# Patient Record
Sex: Female | Born: 1995
Health system: Southern US, Community
[De-identification: ages and names within clinical notes are randomized; demographics above are authoritative.]

## PROBLEM LIST (undated history)

## (undated) DIAGNOSIS — F32A Depression, unspecified: Secondary | ICD-10-CM

## (undated) DIAGNOSIS — L709 Acne, unspecified: Secondary | ICD-10-CM

## (undated) DIAGNOSIS — F329 Major depressive disorder, single episode, unspecified: Secondary | ICD-10-CM

## (undated) HISTORY — DX: Acne, unspecified: L70.9

## (undated) HISTORY — PX: NO PAST SURGERIES: SHX2092

## (undated) HISTORY — DX: Depression, unspecified: F32.A

---
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
## 2019-05-21 ENCOUNTER — Ambulatory Visit (INDEPENDENT_AMBULATORY_CARE_PROVIDER_SITE_OTHER): Payer: No Typology Code available for payment source | Admitting: Family Medicine

## 2019-05-21 ENCOUNTER — Encounter: Payer: Self-pay | Admitting: Family Medicine

## 2019-05-21 ENCOUNTER — Other Ambulatory Visit: Payer: Self-pay

## 2019-05-21 VITALS — BP 100/68 | HR 96 | Temp 98.5°F | Ht 66.0 in | Wt 161.1 lb

## 2019-05-21 DIAGNOSIS — R350 Frequency of micturition: Secondary | ICD-10-CM

## 2019-05-21 LAB — POCT URINALYSIS DIPSTICK
Bilirubin, UA: NEGATIVE
Glucose, UA: NEGATIVE
Ketones, UA: NEGATIVE
Leukocytes, UA: NEGATIVE
Nitrite, UA: NEGATIVE
Protein, UA: NEGATIVE
Spec Grav, UA: 1.015 (ref 1.010–1.025)
Urobilinogen, UA: 0.2 E.U./dL
pH, UA: 6.5 (ref 5.0–8.0)

## 2019-05-21 NOTE — Progress Notes (Signed)
Chief Complaint  Patient presents with  . New Patient (Initial Visit)  . Dysuria  . Urinary Frequency       New Patient Visit SUBJECTIVE: HPI: Sara Odom is an 23 y.o.female who is being seen for establishing care.  Over past several years, she will get recurring dysuria, freq, odor, cloudy, urgency, retention. Will use urostat and try to drink cranberry juice. Used to get UTI's when she was much younger. Unsure over past 3 years since this became recurrent. Does not feel overly stressed compared to usual when these flares occur. No issues with constipation. No injury. Denies flank pain, fevers, injury, discharge, new sexual partners.   No Known Allergies  Past Medical History:  Diagnosis Date  . Depression    Past Surgical History:  Procedure Laterality Date  . NO PAST SURGERIES     Family History  Problem Relation Age of Onset  . Hypertension Mother   . Diabetes Father    No Known Allergies   Takes no meds routinely.   ROS Const Denies fevers  GU: Denies bleeding   OBJECTIVE: BP 100/68 (BP Location: Left Arm, Patient Position: Sitting, Cuff Size: Normal)   Pulse 96   Temp 98.5 F (36.9 C) (Oral)   Ht 5\' 6"  (1.676 m)   Wt 161 lb 2 oz (73.1 kg)   SpO2 98%   BMI 26.01 kg/m   Constitutional: -  VS reviewed -  Well developed, well nourished, appears stated age -  No apparent distress  Psychiatric: -  Oriented to person, place, and time -  Memory intact -  Affect and mood normal -  Fluent conversation, good eye contact -  Judgment and insight age appropriate  Eye: -  Conjunctivae clear, no discharge -  Pupils symmetric, round, reactive to light  ENMT: -  MMM    Pharynx moist, no exudate, no erythema  Neck: -  No gross swelling, no palpable masses -  Thyroid midline, not enlarged, mobile, no palpable masses  Cardiovascular: -  RRR -  No LE edema  Respiratory: -  Normal respiratory effort, no accessory muscle use, no retraction -  Breath  sounds equal, no wheezes, no ronchi, no crackles  Gastrointestinal: -  Bowel sounds normal -  No tenderness, no distention, no guarding, no masses  Skin: -  No significant lesion on inspection -  Warm and dry to palpation   ASSESSMENT/PLAN: Frequent urination - Plan: POCT Urinalysis Dipstick, Urine Culture, offered emp tx, declined, will wait for cx. Could be related to constipation, but less likely given hx. Would consider pelvic floor dys if no answers with urine, GYN info given. IC also consideration.    Patient should return in 10 mo for CPE, sooner if ins requiring before 9/1.Marland Kitchen The patient voiced understanding and agreement to the plan.   [place], DO 05/21/19  3:33 PM

## 2019-05-21 NOTE — Patient Instructions (Addendum)
Stay hydrated.   Warning signs/symptoms: Uncontrollable nausea/vomiting, fevers, worsening symptoms despite treatment, confusion.  Give [place] around 2 business days to get culture back to you.  I always consider constipation as a cause of symptoms like this.  Pelvic floor muscle spasms can also contribute to this. If no good answer is found from this workup, I would look there next.  Mind caffeine, alcohol, and acidic food intake.   Call Center for [place] at [facility] at [phone] for an appointment.  They are located at [address], [address], [place], [place], [address] (right across the hall from our office).   Let us know if you need anything.

## 2019-05-22 LAB — URINE CULTURE
MICRO NUMBER:: 748675
SPECIMEN QUALITY:: ADEQUATE

## 2019-05-23 ENCOUNTER — Encounter: Payer: Self-pay | Admitting: Family Medicine

## 2019-08-09 ENCOUNTER — Other Ambulatory Visit: Payer: Self-pay

## 2019-08-09 ENCOUNTER — Encounter: Payer: Self-pay | Admitting: Family Medicine

## 2019-08-09 ENCOUNTER — Ambulatory Visit (INDEPENDENT_AMBULATORY_CARE_PROVIDER_SITE_OTHER): Payer: No Typology Code available for payment source | Admitting: Family Medicine

## 2019-08-09 VITALS — BP 108/68 | HR 88 | Temp 97.3°F | Ht 66.0 in | Wt 167.5 lb

## 2019-08-09 DIAGNOSIS — Z3009 Encounter for other general counseling and advice on contraception: Secondary | ICD-10-CM | POA: Diagnosis not present

## 2019-08-09 DIAGNOSIS — Z30017 Encounter for initial prescription of implantable subdermal contraceptive: Secondary | ICD-10-CM | POA: Diagnosis not present

## 2019-08-09 LAB — POCT URINE PREGNANCY: Preg Test, Ur: NEGATIVE

## 2019-08-09 MED ORDER — ETONOGESTREL 68 MG ~~LOC~~ IMPL
68.0000 mg | DRUG_IMPLANT | Freq: Once | SUBCUTANEOUS | Status: AC
  Administered 2019-08-09: 68 mg via SUBCUTANEOUS

## 2019-08-09 NOTE — Progress Notes (Signed)
Chief Complaint  Patient presents with  . Contraception    Nexplanon    Subjective: Patient is a 23 y.o. female here for contraception discussion.  Pt interested in Sara Odom. Has been on OCPs in past. Is currently on period. Not transitioning from another form of contraception.   ROS: GU: Normal periods  Past Medical History:  Diagnosis Date  . Depression     Objective: BP 108/68 (BP Location: Left Arm, Patient Position: Sitting, Cuff Size: Normal)   Pulse 88   Temp (!) 97.3 F (36.3 C) (Temporal)   Ht 5\' 6"  (1.676 m)   Wt 167 lb 8 oz (76 kg)   LMP 08/07/2019 (Exact Date)   SpO2 96%   BMI 27.04 kg/m  General: Awake, appears stated age Lungs: No accessory muscle use Psych: Age appropriate judgment and insight, normal affect and mood  Procedure Note, Nexplanon placement: Informed consent obtained. The patient was placed in a comfortable supine position and the non-dominant arm, left, was positioned to access the sulcus between the bicep and triceps muscles.  Measurements were made, and markings were placed at 8 cm, 10 cm, 12 cm and 14 cm from the medial epicondyle of the humerus.  The area was prepped with alcohol and a 27 gauge needle was used to inject 3 mL of 1% lidocaine with epinephrine.  The area was then cleaned with betadine. Sterile gloves were then utilized. The Nexplanon trocar was then inserted at the end of anesthetized area and pushed gently through the subcutaneous tissue along the line of the sulcus.  The seal was broken and the implant was held in place while the trocar was withdrawn.  The implant was then palpated by both the patient and me.  The arm was cleansed and the puncture site from the trocar covered with triple antibiotic ointment and pressure dressing.  Hemostasis was observed at the site. There were no complications noted.  The patient tolerated the procedure well.  She was instructed that the device must be removed in three  years.   Assessment and Plan: Contraceptive education  Insertion of Nexplanon  Counseled on various methods of contraception. Discussed risks and benefits of Nexplanon. She wished to proceed. Preg test neg. 7 d back up contraception rec'd.  F/u prn.  The patient voiced understanding and agreement to the plan.  [place], DO 08/09/19  4:22 PM

## 2019-08-09 NOTE — Patient Instructions (Addendum)
Do not shower for the rest of the day. When you do wash it, use only soap and water. Do not vigorously scrub. Keep the area clean and dry.   Things to look out for: increasing pain not relieved by ibuprofen/acetaminophen, fevers, spreading redness, drainage of pus, or foul odor.  Have back up for contraception for the next 7 days.   Let us know if you need anything.

## 2019-08-09 NOTE — Addendum Note (Signed)
Addended by: Sharon Seller B on: 08/09/2019 04:52 PM   Modules accepted: Orders

## 2020-03-20 ENCOUNTER — Other Ambulatory Visit: Payer: Self-pay

## 2020-03-20 ENCOUNTER — Other Ambulatory Visit (HOSPITAL_COMMUNITY)
Admission: RE | Admit: 2020-03-20 | Discharge: 2020-03-20 | Disposition: A | Discharge status: Home / Self Care | Payer: No Typology Code available for payment source | Source: Ambulatory Visit | Attending: Family Medicine | Admitting: Family Medicine

## 2020-03-20 ENCOUNTER — Encounter: Payer: Self-pay | Admitting: Family Medicine

## 2020-03-20 ENCOUNTER — Ambulatory Visit (INDEPENDENT_AMBULATORY_CARE_PROVIDER_SITE_OTHER): Payer: No Typology Code available for payment source | Admitting: Family Medicine

## 2020-03-20 VITALS — BP 108/64 | HR 102 | Temp 96.9°F | Ht 66.0 in | Wt 159.5 lb

## 2020-03-20 DIAGNOSIS — Z23 Encounter for immunization: Secondary | ICD-10-CM

## 2020-03-20 DIAGNOSIS — Z113 Encounter for screening for infections with a predominantly sexual mode of transmission: Secondary | ICD-10-CM | POA: Insufficient documentation

## 2020-03-20 DIAGNOSIS — L709 Acne, unspecified: Secondary | ICD-10-CM

## 2020-03-20 DIAGNOSIS — Z0000 Encounter for general adult medical examination without abnormal findings: Secondary | ICD-10-CM | POA: Diagnosis not present

## 2020-03-20 DIAGNOSIS — Z114 Encounter for screening for human immunodeficiency virus [HIV]: Secondary | ICD-10-CM

## 2020-03-20 LAB — COMPREHENSIVE METABOLIC PANEL
ALT: 25 U/L (ref 0–35)
AST: 19 U/L (ref 0–37)
Albumin: 4.3 g/dL (ref 3.5–5.2)
Alkaline Phosphatase: 96 U/L (ref 39–117)
BUN: 15 mg/dL (ref 6–23)
CO2: 27 mEq/L (ref 19–32)
Calcium: 9.2 mg/dL (ref 8.4–10.5)
Chloride: 105 mEq/L (ref 96–112)
Creatinine, Ser: 0.89 mg/dL (ref 0.40–1.20)
GFR: 78.53 mL/min (ref >60.00)
Glucose, Bld: 92 mg/dL (ref 70–99)
Potassium: 4.6 mEq/L (ref 3.5–5.1)
Sodium: 138 mEq/L (ref 135–145)
Total Bilirubin: 0.5 mg/dL (ref 0.2–1.2)
Total Protein: 7.3 g/dL (ref 6.0–8.3)

## 2020-03-20 LAB — CBC
HCT: 39.9 % (ref 36.0–46.0)
Hemoglobin: 13.9 g/dL (ref 12.0–15.0)
MCHC: 34.8 g/dL (ref 30.0–36.0)
MCV: 91.9 fl (ref 78.0–100.0)
Platelets: 259 10*3/uL (ref 150.0–400.0)
RBC: 4.35 Mil/uL (ref 3.87–5.11)
RDW: 12.3 % (ref 11.5–15.5)
WBC: 6.1 10*3/uL (ref 4.0–10.5)

## 2020-03-20 LAB — LIPID PANEL
Cholesterol: 115 mg/dL (ref 0–200)
HDL: 31.6 mg/dL — ABNORMAL LOW (ref >39.00)
LDL Cholesterol: 76 mg/dL (ref 0–99)
NonHDL: 83.85
Total CHOL/HDL Ratio: 4
Triglycerides: 41 mg/dL (ref 0.0–149.0)
VLDL: 8.2 mg/dL (ref 0.0–40.0)

## 2020-03-20 NOTE — Addendum Note (Signed)
Addended by: Scharlene Gloss B on: 03/20/2020 10:12 AM   Modules accepted: Orders

## 2020-03-20 NOTE — Patient Instructions (Addendum)
Give us 2-3 business days to get the results of your labs back.  ° °Keep the diet clean and stay active. ° °OK to use Debrox (peroxide) in the ear to loosen up wax. Also recommend using a bulb syringe (for removing boogers from baby's noses) to flush through warm water. Do not use Q-tips as this can impact wax further. ° °If you do not hear anything about your referral in the next 1-2 weeks, call our office and ask for an update. ° °Let us know if you need anything. °

## 2020-03-20 NOTE — Progress Notes (Signed)
Chief Complaint  Patient presents with   Annual Exam     Well Woman Sara Odom is here for a complete physical.   Her last physical was >1 year ago.  Current diet: in general, a "healthy" diet. Current exercise: cardio, wt resistance exericse  Fatigue? Not out of ordinary Seatbelt? Yes  Health Maintenance Pap/HPV- Yes Tetanus- No HIV screening- No STI screening- No  Past Medical History:  Diagnosis Date   Depression      Past Surgical History:  Procedure Laterality Date   NO PAST SURGERIES      Medications  Takes no meds routinely.    Allergies No Known Allergies  Review of Systems: Constitutional:  no unexpected weight changes Eye:  no recent significant change in vision Ear/Nose/Mouth/Throat:  Ears:  no tinnitus or vertigo and no recent change in hearing Nose/Mouth/Throat:  no complaints of nasal congestion, no sore throat Cardiovascular: no chest pain Respiratory:  no cough and no shortness of breath Gastrointestinal:  no abdominal pain, no change in bowel habits GU:  Female: negative for dysuria or pelvic pain Musculoskeletal/Extremities:  no pain of the joints Integumentary (Skin/Breast):  +acne on upper back Neurologic:  no headaches Endocrine:  denies fatigue Hematologic/Lymphatic:  No areas of easy bleeding  Exam BP 108/64 (BP Location: Left Arm, Patient Position: Sitting, Cuff Size: Normal)    Pulse (!) 102    Temp (!) 96.9 F (36.1 C) (Temporal)    Ht 5\' 6"  (1.676 m)    Wt 159 lb 8 oz (72.3 kg)    SpO2 100%    BMI 25.74 kg/m  General:  well developed, well nourished, in no apparent distress Skin:  no significant moles, warts, or growths on exposed skin Head:  no masses, lesions, or tenderness Eyes:  pupils equal and round, sclera anicteric without injection Ears:  canals without lesions, TMs shiny without retraction, no obvious effusion, no erythema Nose:  nares patent, septum midline, mucosa normal, and no drainage or sinus  tenderness Throat/Pharynx:  lips and gingiva without lesion; tongue and uvula midline; non-inflamed pharynx; no exudates or postnasal drainage Neck: neck supple without adenopathy, thyromegaly, or masses Lungs:  clear to auscultation, breath sounds equal bilaterally, no respiratory distress Cardio:  regular rate and rhythm, no bruits, no LE edema Abdomen:  abdomen soft, nontender; bowel sounds normal; no masses or organomegaly Genital: Defer to GYN Musculoskeletal:  symmetrical muscle groups noted without atrophy or deformity Extremities:  no clubbing, cyanosis, or edema, no deformities, no skin discoloration Neuro:  gait normal; deep tendon reflexes normal and symmetric Psych: well oriented with normal range of affect and appropriate judgment/insight  Assessment and Plan  Well adult exam - Plan: CBC, Comprehensive metabolic panel, Lipid panel  Screening for HIV (human immunodeficiency virus) - Plan: HIV Antibody (routine testing w rflx)  Routine screening for STI (sexually transmitted infection) - Plan: Urine cytology ancillary only([place])  Acne, unspecified acne type - Plan: Ambulatory referral to Dermatology   Well 24 y.o. female. Counseled on diet and exercise. Other orders as above. Follow up in 1 yr or prn. The patient voiced understanding and agreement to the plan.  [address] [place], DO 03/20/20 10:06 AM

## 2020-03-21 LAB — URINE CYTOLOGY ANCILLARY ONLY
Chlamydia: NEGATIVE
Comment: NEGATIVE
Comment: NEGATIVE
Comment: NORMAL
Neisseria Gonorrhea: NEGATIVE
Trichomonas: NEGATIVE

## 2020-03-21 LAB — HIV ANTIBODY (ROUTINE TESTING W REFLEX): HIV 1&2 Ab, 4th Generation: NONREACTIVE

## 2020-03-23 ENCOUNTER — Telehealth: Payer: Self-pay | Admitting: Physician Assistant

## 2020-03-23 NOTE — Telephone Encounter (Signed)
Patient called to schedule referral appointment.  The appointment was scheduled for 06/29/20 at 10:45 a.m. with Shelly Flatten.

## 2020-05-07 ENCOUNTER — Other Ambulatory Visit: Payer: Self-pay

## 2020-05-07 ENCOUNTER — Emergency Department (HOSPITAL_COMMUNITY)
Admission: EM | Admit: 2020-05-07 | Discharge: 2020-05-08 | Disposition: A | Discharge status: Home / Self Care | Payer: No Typology Code available for payment source | Attending: Emergency Medicine | Admitting: Emergency Medicine

## 2020-05-07 ENCOUNTER — Encounter (HOSPITAL_COMMUNITY): Payer: Self-pay

## 2020-05-07 ENCOUNTER — Emergency Department (HOSPITAL_COMMUNITY): Payer: No Typology Code available for payment source

## 2020-05-07 DIAGNOSIS — Y929 Unspecified place or not applicable: Secondary | ICD-10-CM | POA: Insufficient documentation

## 2020-05-07 DIAGNOSIS — S61411A Laceration without foreign body of right hand, initial encounter: Secondary | ICD-10-CM | POA: Diagnosis not present

## 2020-05-07 DIAGNOSIS — Y939 Activity, unspecified: Secondary | ICD-10-CM | POA: Insufficient documentation

## 2020-05-07 DIAGNOSIS — W208XXA Other cause of strike by thrown, projected or falling object, initial encounter: Secondary | ICD-10-CM | POA: Insufficient documentation

## 2020-05-07 DIAGNOSIS — S61511A Laceration without foreign body of right wrist, initial encounter: Secondary | ICD-10-CM | POA: Diagnosis not present

## 2020-05-07 DIAGNOSIS — S66322A Laceration of extensor muscle, fascia and tendon of right middle finger at wrist and hand level, initial encounter: Secondary | ICD-10-CM | POA: Diagnosis not present

## 2020-05-07 DIAGNOSIS — Y998 Other external cause status: Secondary | ICD-10-CM | POA: Insufficient documentation

## 2020-05-07 DIAGNOSIS — S66901S Unspecified injury of unspecified muscle, fascia and tendon at wrist and hand level, right hand, sequela: Secondary | ICD-10-CM

## 2020-05-07 NOTE — ED Triage Notes (Addendum)
Patient here for lac to right forearm. Patient states the vase was falling and she caught it, which cut here. Patient  Is very tearful and anxious at this time. Patient has minimal movement and sensation to right 2nd finger. < 2 sec capillary refill.

## 2020-05-07 NOTE — ED Provider Notes (Signed)
[facility]-EMERGENCY DEPT Provider Note   CSN: [id number] Arrival date & time: 05/07/20  2150   Time seen 11:23 PM  History Chief Complaint  Patient presents with  . Laceration    right forearm    Sara Odom is a 24 y.o. female.  HPI   Patient states about 9:30 PM a vase fell off a shelf and landed on a countertop that she was standing by and the shards lacerated the dorsum of her right wrist and hand.  She reports loss of range of motion of her right middle finger but has intact sensation.  Patient is right-handed.  She states her last tetanus was in the past month.  She states the vase was clear glass in color.  PCP Sharlene Dory, DO   Past Medical History:  Diagnosis Date  . Depression     There are no problems to display for this patient.   Past Surgical History:  Procedure Laterality Date  . NO PAST SURGERIES       OB History   No obstetric history on file.     Family History  Problem Relation Age of Onset  . Hypertension Mother   . Diabetes Father     Social History   Tobacco Use  . Smoking status: Never Smoker  . Smokeless tobacco: Never Used  Vaping Use  . Vaping Use: Never used  Substance Use Topics  . Alcohol use: Yes    Alcohol/week: 4.0 standard drinks    Types: 4 Standard drinks or equivalent per week    Comment: 1 x week   . Drug use: Yes    Frequency: 1.0 times per week    Types: Marijuana    Comment: last week    Home Medications Prior to Admission medications   Not on File    Allergies    Shellfish allergy  Review of Systems   Review of Systems  All other systems reviewed and are negative.   Physical Exam Updated Vital Signs BP (!) 127/87 (BP Location: Left Arm)   Pulse 93   Temp 98.4 F (36.9 C) (Oral)   Resp 20   Ht 5\' 6"  (1.676 m)   Wt 70.3 kg   LMP 03/22/2020 (Approximate)   SpO2 98%   BMI 25.02 kg/m   Physical Exam Vitals and nursing note reviewed.   Constitutional:      General: She is in acute distress.     Appearance: Normal appearance. She is normal weight.  HENT:     Head: Normocephalic and atraumatic.     Right Ear: External ear normal.     Left Ear: External ear normal.  Eyes:     Extraocular Movements: Extraocular movements intact.     Conjunctiva/sclera: Conjunctivae normal.  Cardiovascular:     Rate and Rhythm: Normal rate.  Pulmonary:     Effort: Pulmonary effort is normal. No respiratory distress.  Musculoskeletal:     Cervical back: Normal range of motion.     Comments: On exam of her right hand patient is noted to have her right middle finger held in a position that is more flexed than her other fingers.  She does have intact full flexion of that finger.  However she is unable to extend that finger. Patient is noted to have a 3 cm linear laceration over the radial aspect of her distal mid forearm on the right. She has a another 3 cm laceration over the dorsum of her right hand  proximally near the wrist.  It has a slow active bleeding present.  There are also some more superficial lacerations over the distal portion of the dorsum of the hand.  There is swelling noted on the dorsum of her hand.  Skin:    General: Skin is warm and dry.  Neurological:     General: No focal deficit present.     Mental Status: She is alert and oriented to person, place, and time.     Cranial Nerves: No cranial nerve deficit.  Psychiatric:        Mood and Affect: Affect is tearful.        Speech: Speech normal.        Behavior: Behavior normal.        Thought Content: Thought content normal.         ED Results / Procedures / Treatments   Labs (all labs ordered are listed, but only abnormal results are displayed) Labs Reviewed - No data to display  EKG None  Radiology DG Hand Complete Right  Result Date: 05/07/2020 CLINICAL DATA:  24 year old female with laceration of the dorsum of the right hand. EXAM: RIGHT HAND -  COMPLETE 3+ VIEW COMPARISON:  None. FINDINGS: There is no acute fracture or dislocation. The bones are well mineralized. No arthritic changes. Soft tissue swelling of the dorsum of the hand. No radiopaque foreign object. Dressing is noted over the hand. IMPRESSION: No acute/traumatic osseous pathology. Electronically Signed   By: Elgie Collard M.D.   On: 05/07/2020 23:55    Procedures Procedures (including critical care time)  Medications Ordered in ED Medications  lidocaine-EPINEPHrine (XYLOCAINE W/EPI) 2 %-1:200000 (PF) injection 20 mL (20 mLs Infiltration Given by Other 05/08/20 0007)    ED Course  I have reviewed the triage vital signs and the nursing notes.  Pertinent labs & imaging results that were available during my care of the patient were reviewed by me and considered in my medical decision making (see chart for details).    MDM Rules/Calculators/A&P                         Patient's wounds were rewrapped by myself and nursing staff.  X-ray was obtained to look for foreign body.  I explained to the patient that I feel that she probably has a laceration of the extensor tendon of her right middle finger and will probably need a specialist to repair it however most likely we will close the superficial layer of her lacerations tonight and she will follow up with a hand specialist.  12:06 AM patient discussed with Dr. Aundria Odom, hand specialist on-call.  He states to put a volar splint when we are done suturing her wounds so that the finger is splinted in extension.  He does not feel like she needs antibiotics at this time.  She can follow-up at the office.  PT was sutured by PA Margarette Asal  Final Clinical Impression(s) / ED Diagnoses Final diagnoses:  Laceration of right hand, foreign body presence unspecified, initial encounter  Hand, open wound except fingers, with tendon injury, right, sequela  Laceration of extensor muscle, fascia and tendon of right middle finger at wrist and hand  level, initial encounter    Rx / DC Orders ED Discharge Orders    None     Plan discharge  Devoria Albe, MD, Concha Pyo, MD 05/08/20 [id number]

## 2020-05-08 MED ORDER — LIDOCAINE-EPINEPHRINE (PF) 2 %-1:200000 IJ SOLN
20.0000 mL | Freq: Once | INTRAMUSCULAR | Status: AC
  Administered 2020-05-08: 20 mL
  Filled 2020-05-08: qty 20

## 2020-05-08 NOTE — Discharge Instructions (Signed)
Elevate your hand for comfort and to reduce swelling. You may use ice packs over the top of your hand where you had the swelling. Tylenol 650 mg every 6 hrs if needed for pain. Wear the splint until you can be rechecked by Dr Aundria Rud, the hand specialist on call tonight. Call his office this morning and tell them we talked to him tonight about your injury. You have an extensor injury to your right middle finger from a laceration on the top of your hand. He will need to see you to discuss repair of the tendon injury.  Return to the ED for excessive bleeding, worsening pain, fever, or numbness in your fingers.

## 2020-05-08 NOTE — ED Provider Notes (Signed)
Physical Exam  BP (!) 127/87 (BP Location: Left Arm)   Pulse 93   Temp 98.4 F (36.9 C) (Oral)   Resp 20   Ht 5\' 6"  (1.676 m)   Wt 70.3 kg   LMP 03/22/2020 (Approximate)   SpO2 98%   BMI 25.02 kg/m   Physical Exam Musculoskeletal:     Comments: Decreased right 3rd finger extension strength against resistance noted   Skin:    Findings: Abrasion and laceration present.     Comments: Several linear abrasion right dorsal and lateral hand 1 laceration right dorsal wrist measures 4 cm, gaping with subcutaneous tissue exposed, hemostatic  1 laceration right dorsal hand measures 3.5 cm, with small persistent pulsatile bleeding, adipose tissue. No tendon visualized  1 laceration right dorsal 4th/5th MCP measures 1 cm, gaping, hemostatic     ED Course/Procedures     .08/09/2021Laceration Repair  Date/Time: 05/08/2020 1:40 AM Performed by: 05/10/2020, PA-C Authorized by: Liberty Handy, PA-C   Consent:    Consent obtained:  Verbal   Consent given by:  Patient   Risks discussed:  Infection, need for additional repair, pain, poor cosmetic result and poor wound healing   Alternatives discussed:  No treatment and delayed treatment Universal protocol:    Procedure explained and questions answered to patient or proxy's satisfaction: yes     Relevant documents present and verified: yes     Test results available and properly labeled: yes     Imaging studies available: yes     Required blood products, implants, devices, and special equipment available: yes     Site/side marked: yes     Immediately prior to procedure, a time out was called: yes     Patient identity confirmed:  Verbally with patient Anesthesia (see MAR for exact dosages):    Anesthesia method:  Local infiltration   Local anesthetic:  Lidocaine 2% WITH epi Laceration details:    Location:  Hand   Hand location:  R hand, dorsum   Length (cm):  4 Repair type:    Repair type:  Complex Pre-procedure details:     Preparation:  Patient was prepped and draped in usual sterile fashion and imaging obtained to evaluate for foreign bodies Exploration:    Limited defect created (wound extended): no     Hemostasis achieved with:  Tied off vessels, direct pressure and epinephrine   Wound exploration: wound explored through full range of motion and entire depth of wound probed and visualized     Wound extent: no foreign bodies/material noted, no underlying fracture noted and no vascular damage noted   Treatment:    Area cleansed with:  Betadine and saline   Amount of cleaning:  Extensive   Irrigation volume:  100   Irrigation method:  Pressure wash   Visualized foreign bodies/material removed: no   Subcutaneous repair:    Suture size:  4-0   Suture material:  Vicryl (2)   Suture technique:  Horizontal mattress   Number of sutures:  2 Skin repair:    Repair method:  Sutures   Suture size:  4-0   Wound skin closure material used: ethilon.   Suture technique:  Simple interrupted   Number of sutures:  5 Approximation:    Approximation:  Close Post-procedure details:    Dressing:  Tube gauze (coban )   Patient tolerance of procedure:  Tolerated well, no immediate complications .Liberty HandyLaceration Repair  Date/Time: 05/08/2020 1:43 AM Performed by: 05/10/2020, PA-C Authorized  by: Liberty Handy, PA-C   Consent:    Consent obtained:  Verbal   Consent given by:  Patient   Risks discussed:  Infection, need for additional repair, pain, poor cosmetic result and poor wound healing   Alternatives discussed:  No treatment and delayed treatment Universal protocol:    Procedure explained and questions answered to patient or proxy's satisfaction: yes     Relevant documents present and verified: yes     Test results available and properly labeled: yes     Imaging studies available: yes     Required blood products, implants, devices, and special equipment available: yes     Site/side marked: yes      Immediately prior to procedure, a time out was called: yes     Patient identity confirmed:  Verbally with patient Anesthesia (see MAR for exact dosages):    Anesthesia method:  Local infiltration   Local anesthetic:  Lidocaine 2% WITH epi Laceration details:    Location:  Hand   Hand location:  R hand, dorsum   Length (cm):  3.5 Repair type:    Repair type:  Complex Pre-procedure details:    Preparation:  Patient was prepped and draped in usual sterile fashion and imaging obtained to evaluate for foreign bodies Exploration:    Limited defect created (wound extended): no     Hemostasis achieved with:  Epinephrine and direct pressure   Wound exploration: wound explored through full range of motion and entire depth of wound probed and visualized     Wound extent: vascular damage   Treatment:    Area cleansed with:  Betadine and saline   Amount of cleaning:  Extensive   Irrigation volume:  100   Irrigation method:  Pressure wash   Visualized foreign bodies/material removed: no   Skin repair:    Repair method:  Sutures   Suture size:  4-0   Wound skin closure material used: ethilon.   Suture technique:  Simple interrupted   Number of sutures:  4 Approximation:    Approximation:  Close Post-procedure details:    Dressing:  Tube gauze (coban)   Patient tolerance of procedure:  Tolerated well, no immediate complications .Marland KitchenLaceration Repair  Date/Time: 05/08/2020 1:45 AM Performed by: Liberty Handy, PA-C Authorized by: Liberty Handy, PA-C   Consent:    Consent obtained:  Verbal   Consent given by:  Patient   Risks discussed:  Infection, need for additional repair, pain, poor cosmetic result and poor wound healing   Alternatives discussed:  No treatment and delayed treatment Universal protocol:    Procedure explained and questions answered to patient or proxy's satisfaction: yes     Relevant documents present and verified: yes     Test results available and properly  labeled: yes     Imaging studies available: yes     Required blood products, implants, devices, and special equipment available: yes     Site/side marked: yes     Immediately prior to procedure, a time out was called: yes     Patient identity confirmed:  Verbally with patient Anesthesia (see MAR for exact dosages):    Anesthesia method:  Local infiltration   Local anesthetic:  Lidocaine 2% WITH epi Laceration details:    Location:  Hand   Hand location:  R hand, dorsum   Length (cm):  1 Repair type:    Repair type:  Simple Pre-procedure details:    Preparation:  Patient was prepped and draped in  usual sterile fashion and imaging obtained to evaluate for foreign bodies Exploration:    Hemostasis achieved with:  Epinephrine and direct pressure   Wound exploration: wound explored through full range of motion and entire depth of wound probed and visualized     Wound extent: no underlying fracture noted and no vascular damage noted   Treatment:    Area cleansed with:  Betadine and saline   Amount of cleaning:  Standard   Irrigation method:  Pressure wash   Visualized foreign bodies/material removed: no   Skin repair:    Repair method:  Sutures   Suture size:  4-0   Wound skin closure material used: ethilon.   Suture technique:  Simple interrupted   Number of sutures:  2 Approximation:    Approximation:  Close Post-procedure details:    Dressing:  Tube gauze (gauze coban)   Patient tolerance of procedure:  Tolerated well, no immediate complications    MDM   Patient under direct care of EDP Knapp.  I was asked to repair lacerations.  3 required suture repair. Several abrasions hemostatic. Small piece of likely gas removed from small superficial abrasion right lateral hand.      Liberty Handy, PA-C 05/08/20 1007    Devoria Albe, MD 05/08/20 [phone]

## 2020-06-29 ENCOUNTER — Ambulatory Visit (INDEPENDENT_AMBULATORY_CARE_PROVIDER_SITE_OTHER): Payer: No Typology Code available for payment source | Admitting: Physician Assistant

## 2020-06-29 ENCOUNTER — Encounter: Payer: Self-pay | Admitting: Physician Assistant

## 2020-06-29 ENCOUNTER — Other Ambulatory Visit: Payer: Self-pay

## 2020-06-29 DIAGNOSIS — L700 Acne vulgaris: Secondary | ICD-10-CM

## 2020-06-29 MED ORDER — DOXYCYCLINE MONOHYDRATE 50 MG PO CAPS: 50.0000 mg | ORAL_CAPSULE | Freq: Two times a day (BID) | ORAL | 2 refills | Status: DC

## 2020-06-29 MED ORDER — DAPSONE 7.5 % EX GEL: 1.0000 "application " | Freq: Every day | CUTANEOUS | 2 refills | Status: DC

## 2020-06-29 NOTE — Progress Notes (Signed)
   New Patient Visit  Subjective  Sara Odom is a 24 y.o. female who presents for the following: Establish Care and Acne (on back, face, and chest).  Has been a problem for 7-8 years. It is on her face chest and back. She has used panoxyl OTC and multiple other OTC cleansers and gels. 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 The panoxyl was just started 2 weeks ago and is not irritating her skin. She has nexplanon for birth control. Initially her acne seemed to be doing better but then it went back to how it was before.   Objective  Well appearing patient in no apparent distress; mood and affect are within normal limits.   Face, chest, back examined. Relevant physical exam findings are noted in the Assessment and Plan.  Objective  Chest - Medial (Center), Head - Anterior (Face), Left Upper Back, Right Upper Back: Erythematous papules and pustules with comedones   Ipledge brochure given to patient.   Assessment & Plan  Acne vulgaris (4) Head - Anterior (Face); Chest - Medial [facility]); Left Upper Back; Right Upper Back  Ordered Medications: doxycycline (MONODOX) 50 MG capsule Dapsone (ACZONE) 7.5 % GEL

## 2020-08-14 ENCOUNTER — Ambulatory Visit: Payer: No Typology Code available for payment source | Admitting: Physician Assistant

## 2020-08-17 ENCOUNTER — Ambulatory Visit: Payer: No Typology Code available for payment source | Admitting: Dermatology

## 2020-08-22 ENCOUNTER — Other Ambulatory Visit: Payer: Self-pay

## 2020-08-22 ENCOUNTER — Ambulatory Visit (INDEPENDENT_AMBULATORY_CARE_PROVIDER_SITE_OTHER): Payer: No Typology Code available for payment source | Admitting: Dermatology

## 2020-08-22 ENCOUNTER — Encounter: Payer: Self-pay | Admitting: Dermatology

## 2020-08-22 ENCOUNTER — Other Ambulatory Visit: Payer: Self-pay | Admitting: Dermatology

## 2020-08-22 DIAGNOSIS — L700 Acne vulgaris: Secondary | ICD-10-CM | POA: Diagnosis not present

## 2020-08-22 MED ORDER — SULFAMETHOXAZOLE-TRIMETHOPRIM 400-80 MG PO TABS: 1.0000 | ORAL_TABLET | Freq: Two times a day (BID) | ORAL | 2 refills | Status: DC

## 2020-08-22 MED ORDER — TRETINOIN MICROSPHERE 0.04 % EX GEL: Freq: Every day | CUTANEOUS | 0 refills | Status: DC

## 2020-08-22 MED FILL — TRETINOIN GEL MICRO 0.04% T: 0.04 | 30 days supply | Qty: 45 | Fill #0

## 2020-08-22 MED FILL — SULFAMETHOXAZOLE-TMP SS TAB: 400-80 | 30 days supply | Qty: 60 | Fill #0

## 2020-08-22 NOTE — Progress Notes (Signed)
Dapsone 7.5% Doxy 50 bid

## 2020-08-28 ENCOUNTER — Telehealth: Payer: Self-pay | Admitting: Dermatology

## 2020-08-28 NOTE — Telephone Encounter (Signed)
Patient left message on office voice mail saying that she was taking an antibiotic that was prescribed to her at her last visit and she woke up this morning with a rash.

## 2020-08-28 NOTE — Telephone Encounter (Signed)
Patient called has been taking medication for 4 days. Woke up this morning with rash on face, neck, torso( entire upper body)  The rash is pretty itchy and patient taking Benadryl.  Patient informed to Discontinue the Medication prescribed until she hears form [place] with further instructions.  Informed patient to seek medical attention if symptoms worse or she becomes short of breat or difficulty breathing.  Patient understood.

## 2020-08-29 ENCOUNTER — Telehealth: Payer: Self-pay | Admitting: Dermatology

## 2020-08-29 ENCOUNTER — Ambulatory Visit (INDEPENDENT_AMBULATORY_CARE_PROVIDER_SITE_OTHER): Payer: No Typology Code available for payment source

## 2020-08-29 ENCOUNTER — Other Ambulatory Visit: Payer: Self-pay

## 2020-08-29 DIAGNOSIS — R21 Rash and other nonspecific skin eruption: Secondary | ICD-10-CM | POA: Diagnosis not present

## 2020-08-29 MED ORDER — TRIAMCINOLONE ACETONIDE 40 MG/ML IJ SUSP
40.0000 mg | Freq: Once | INTRAMUSCULAR | Status: AC
  Administered 2020-08-29: 40 mg

## 2020-08-29 NOTE — Telephone Encounter (Signed)
Returning call---she had reaction to med

## 2020-08-29 NOTE — Telephone Encounter (Signed)
If still very rash and itchy today, she can come in for IM triamcinolone 60 mg, buttocks.

## 2020-08-29 NOTE — Telephone Encounter (Signed)
Phone call to patient with Dr. Sherryl Barters recommendations.  Patient aware, patient will come in today for the IM injection.

## 2020-08-29 NOTE — Telephone Encounter (Signed)
Phone call to patient with Dr. Tafeen's recommendations. Voicemail left for patient to give the office a call back.  

## 2020-09-01 NOTE — Progress Notes (Signed)
I, Janalyn Harder, MD, have reviewed all documentation for this visit. The documentation on 09/01/20 for the exam, diagnosis, procedures, and orders are all accurate and complete.

## 2020-09-01 NOTE — Progress Notes (Signed)
   Follow-Up Visit   Subjective  Sara Odom is a 24 y.o. female who presents for the following: Rash (ALLERGY TO BACTRIM).  Rash Location: All over Duration: 2 days Quality:  Associated Signs/Symptoms: Modifying Factors:  Severity:  Timing: Context:   Objective  Well appearing patient in no apparent distress; mood and affect are within normal limits.  A focused examination was performed including Head, neck, arms. Relevant physical exam findings are noted in the Assessment and Plan.   Assessment & Plan    Rash (5) Left Forearm - Anterior; Right Forearm - Anterior; Left Forearm - Posterior; Right Forearm - Posterior; Mid Back   I examined the patient at the time of the visit.  She has diffuse more macular than urticarial patches over 50% of her body surface area.  There is no mucositis.  Denies shortness of breath or wheezing.  60 mg IM triamcinolone upper outer buttocks.  Told to immediately go to the emergency room if she feels faint but comes short of breath.   I, Janalyn Harder, MD, have reviewed all documentation for this visit.  The documentation on 09/01/20 for the exam, diagnosis, procedures, and orders are all accurate and complete.

## 2020-09-01 NOTE — Addendum Note (Signed)
Addended by: Janalyn Harder on: 09/01/2020 01:04 PM   Modules accepted: Level of Service

## 2020-09-02 ENCOUNTER — Encounter: Payer: Self-pay | Admitting: Dermatology

## 2020-09-05 ENCOUNTER — Encounter: Payer: Self-pay | Admitting: Dermatology

## 2020-09-05 NOTE — Progress Notes (Signed)
° °  Follow-Up Visit   Subjective  Sara Odom is a 24 y.o. female who presents for the following: Acne (dapsone 5.5% rash =(face chest) patient stopped med rash and  went away, minocycline 50 bid forehead improved not so much on chest and back).  Acne Location: Face and torso Duration:  Quality: Slight improvement Associated Signs/Symptoms: Modifying Factors: Dapsone gel caused rash, minocycline helped a little Severity:  Timing: Context:   Objective  Well appearing patient in no apparent distress; mood and affect are within normal limits.  A focused examination was performed including Head, neck, upper torso. Relevant physical exam findings are noted in the Assessment and Plan.   Assessment & Plan    Acne vulgaris Head - Anterior (Face)  This continue oral minocycline and begin oral generic regular strength SMZ-TMP. Emphasized that if she developed any rash or signs of allergy she must discontinue the medication and call me immediately. We will add topical tretinoin.. Discussed isotretinoin.  Ordered Medications: tretinoin microspheres (RETIN-A MICRO) 0.04 % gel sulfamethoxazole-trimethoprim (BACTRIM) 400-80 MG tablet      I, Janalyn Harder, MD, have reviewed all documentation for this visit.  The documentation on 09/05/20 for the exam, diagnosis, procedures, and orders are all accurate and complete.

## 2020-09-11 NOTE — Progress Notes (Signed)
See photo

## 2020-09-11 NOTE — Addendum Note (Signed)
Addended by: Johnna Acosta on: 09/11/2020 12:21 PM   Modules accepted: Level of Service

## 2020-11-14 ENCOUNTER — Other Ambulatory Visit: Payer: Self-pay | Admitting: Dermatology

## 2020-11-14 ENCOUNTER — Encounter: Payer: Self-pay | Admitting: Dermatology

## 2020-11-14 ENCOUNTER — Ambulatory Visit (INDEPENDENT_AMBULATORY_CARE_PROVIDER_SITE_OTHER): Payer: No Typology Code available for payment source | Admitting: Dermatology

## 2020-11-14 ENCOUNTER — Other Ambulatory Visit: Payer: Self-pay

## 2020-11-14 VITALS — Wt 155.0 lb

## 2020-11-14 DIAGNOSIS — L700 Acne vulgaris: Secondary | ICD-10-CM

## 2020-11-14 DIAGNOSIS — Z889 Allergy status to unspecified drugs, medicaments and biological substances status: Secondary | ICD-10-CM | POA: Diagnosis not present

## 2020-11-14 LAB — POCT URINE PREGNANCY

## 2020-11-14 MED ORDER — CLARITHROMYCIN 250 MG PO TABS: 250.0000 mg | ORAL_TABLET | Freq: Every day | ORAL | 1 refills | Status: DC

## 2020-11-14 MED FILL — CLARITHROMYCIN 250 MG TABS: 250 | 30 days supply | Qty: 30 | Fill #0

## 2020-11-14 NOTE — Patient Instructions (Signed)
1 % HYDROCORTISONE OINTMENT

## 2020-11-24 ENCOUNTER — Encounter: Payer: Self-pay | Admitting: Dermatology

## 2020-11-24 NOTE — Progress Notes (Signed)
   Follow-Up Visit   Subjective  Sara Odom is a 25 y.o. female who presents for the following: Acne (FACE BACK AND SHOULDERS NOW (PATIENT HAD THE SMZ TMP ALLERGY)).  Acne Location: Sleep face Duration:  Quality:  Associated Signs/Symptoms: Modifying Factors: Allergic reaction to SMZ TMP Severity:  Timing: Context:   Objective  Well appearing patient in no apparent distress; mood and affect are within normal limits. Objective  Head - Anterior (Face): MODERATE SEVERE ACNE MOSTLY INFLAMMATORY, FACIAL, PRONE TO PIH. MULTIPLE TREATMENT OPTIONS REVIEWED AND PATIENT IS MOST INTERESTED IN BEGINNING ISOTRETINOIN. ISOTRET NEW START.   Objective  Chest - Medial [facility]): History of red rash, possibly hives secondary to Bactrim.    A focused examination was performed including Head and neck.. Relevant physical exam findings are noted in the Assessment and Plan.   Assessment & Plan    Acne vulgaris Head - Anterior (Face)  We will try 1 month of low-dose oral clarithromycin. If there is enough improvement, she can cancel her 1 month follow-up to start isotretinoin.  Other Related Procedures POCT urine pregnancy  Ordered Medications: clarithromycin (BIAXIN) 250 MG tablet  Other Related Medications tretinoin microspheres (RETIN-A MICRO) 0.04 % gel sulfamethoxazole-trimethoprim (BACTRIM) 400-80 MG tablet  Drug allergy Chest - Medial [facility])  I reviewed the multiple names that sulfa-containing antibiotics may be called and encouraged her to put a carton while out, wear a medic alert bracelet, and always ask about any new antibiotic she is given.      I, Janalyn Harder, MD, have reviewed all documentation for this visit.  The documentation on 11/24/20 for the exam, diagnosis, procedures, and orders are all accurate and complete.

## 2020-12-18 ENCOUNTER — Other Ambulatory Visit: Payer: Self-pay

## 2020-12-18 ENCOUNTER — Ambulatory Visit (INDEPENDENT_AMBULATORY_CARE_PROVIDER_SITE_OTHER): Payer: No Typology Code available for payment source | Admitting: Dermatology

## 2020-12-18 ENCOUNTER — Other Ambulatory Visit: Payer: Self-pay | Admitting: Dermatology

## 2020-12-18 DIAGNOSIS — L700 Acne vulgaris: Secondary | ICD-10-CM

## 2020-12-18 DIAGNOSIS — Z5181 Encounter for therapeutic drug level monitoring: Secondary | ICD-10-CM | POA: Diagnosis not present

## 2020-12-18 MED ORDER — ISOTRETINOIN 40 MG PO CAPS
40.0000 mg | ORAL_CAPSULE | Freq: Every day | ORAL | 0 refills | Status: DC
Start: 2020-12-18 — End: 2020-12-18

## 2020-12-19 ENCOUNTER — Encounter: Payer: Self-pay | Admitting: Dermatology

## 2020-12-19 LAB — CBC WITH DIFFERENTIAL/PLATELET
Absolute Monocytes: 681 cells/uL (ref 200–950)
Basophils Absolute: 37 cells/uL (ref 0–200)
Basophils Relative: 0.4 %
Eosinophils Absolute: 202 cells/uL (ref 15–500)
Eosinophils Relative: 2.2 %
HCT: 44.2 % (ref 35.0–45.0)
Hemoglobin: 15.3 g/dL (ref 11.7–15.5)
Lymphs Abs: 2769 cells/uL (ref 850–3900)
MCH: 32.6 pg (ref 27.0–33.0)
MCHC: 34.6 g/dL (ref 32.0–36.0)
MCV: 94.2 fL (ref 80.0–100.0)
MPV: 10.1 fL (ref 7.5–12.5)
Monocytes Relative: 7.4 %
Neutro Abs: 5511 cells/uL (ref 1500–7800)
Neutrophils Relative %: 59.9 %
Platelets: 280 10*3/uL (ref 140–400)
RBC: 4.69 10*6/uL (ref 3.80–5.10)
RDW: 12.5 % (ref 11.0–15.0)
Total Lymphocyte: 30.1 %
WBC: 9.2 10*3/uL (ref 3.8–10.8)

## 2020-12-19 LAB — COMPREHENSIVE METABOLIC PANEL
AG Ratio: 1.4 (calc) (ref 1.0–2.5)
ALT: 25 U/L (ref 6–29)
AST: 22 U/L (ref 10–30)
Albumin: 4.6 g/dL (ref 3.6–5.1)
Alkaline phosphatase (APISO): 81 U/L (ref 31–125)
BUN: 24 mg/dL (ref 7–25)
CO2: 30 mmol/L (ref 20–32)
Calcium: 9.7 mg/dL (ref 8.6–10.2)
Chloride: 101 mmol/L (ref 98–110)
Creat: 0.92 mg/dL (ref 0.50–1.10)
Globulin: 3.3 g/dL (calc) (ref 1.9–3.7)
Glucose, Bld: 65 mg/dL (ref 65–139)
Potassium: 4.3 mmol/L (ref 3.5–5.3)
Sodium: 139 mmol/L (ref 135–146)
Total Bilirubin: 0.6 mg/dL (ref 0.2–1.2)
Total Protein: 7.9 g/dL (ref 6.1–8.1)

## 2020-12-19 LAB — HCG, SERUM, QUALITATIVE: Preg, Serum: NEGATIVE

## 2020-12-19 LAB — LIPID PANEL
Cholesterol: 160 mg/dL (ref <200)
HDL: 41 mg/dL — ABNORMAL LOW (ref >=50)
LDL Cholesterol (Calc): 89 mg/dL (calc)
Non-HDL Cholesterol (Calc): 119 mg/dL (calc) (ref <130)
Total CHOL/HDL Ratio: 3.9 (calc) (ref <5.0)
Triglycerides: 205 mg/dL — ABNORMAL HIGH (ref <150)

## 2020-12-19 MED FILL — MYORISAN 40 MG CAPSULE: 40 | 30 days supply | Qty: 30 | Fill #0

## 2021-01-02 ENCOUNTER — Encounter: Payer: Self-pay | Admitting: Dermatology

## 2021-01-02 NOTE — Progress Notes (Signed)
   Follow-Up Visit   Subjective  Sara Odom is a 25 y.o. female who presents for the following: Acne.  Cystic acne Location: Face Duration:  Quality:  Associated Signs/Symptoms: Modifying Factors: Previous therapies of little benefit Severity:  Timing: Context:   Objective  Well appearing patient in no apparent distress; mood and affect are within normal limits. Objective  Head - Anterior (Face): Cystic acne, predominantly facial.  No isotretinoin start.  All questions relating medication answered in detail.  Need for absolute I pledge compliance emphasized.  Told that she can call me 24/7 with any questions or problems that arise. START 40MG  #30 TODAY ISOTRET PATIENT AWARE 100$ [employer]     A focused examination was performed including Head, neck, upper torso.. Relevant physical exam findings are noted in the Assessment and Plan.   Assessment & Plan    Acne vulgaris Head - Anterior (Face)  Isotretinoin new start, 40 mg daily.  Baseline labs obtained.  CBC with Differential/Platelet - Head - Anterior (Face)  Comprehensive metabolic panel - Head - Anterior (Face)  Lipid panel - Head - Anterior (Face)  hCG, serum, qualitative - Head - Anterior (Face)  Other Related Medications tretinoin microspheres (RETIN-A MICRO) 0.04 % gel sulfamethoxazole-trimethoprim (BACTRIM) 400-80 MG tablet clarithromycin (BIAXIN) 250 MG tablet  Encounter for therapeutic drug monitoring  Other Related Procedures CBC with Differential/Platelet Comprehensive metabolic panel Lipid panel hCG, serum, qualitative      I, , MD, have reviewed all documentation for this visit.  The documentation on 01/02/21 for the exam, diagnosis, procedures, and orders are all accurate and complete.

## 2021-01-18 ENCOUNTER — Encounter: Payer: Self-pay | Admitting: Dermatology

## 2021-01-18 ENCOUNTER — Ambulatory Visit (INDEPENDENT_AMBULATORY_CARE_PROVIDER_SITE_OTHER): Payer: No Typology Code available for payment source | Admitting: Dermatology

## 2021-01-18 ENCOUNTER — Other Ambulatory Visit: Payer: Self-pay

## 2021-01-18 ENCOUNTER — Other Ambulatory Visit (HOSPITAL_BASED_OUTPATIENT_CLINIC_OR_DEPARTMENT_OTHER): Payer: Self-pay

## 2021-01-18 DIAGNOSIS — Z5181 Encounter for therapeutic drug level monitoring: Secondary | ICD-10-CM | POA: Diagnosis not present

## 2021-01-18 DIAGNOSIS — L700 Acne vulgaris: Secondary | ICD-10-CM | POA: Diagnosis not present

## 2021-01-18 MED ORDER — ISOTRETINOIN 40 MG PO CAPS
ORAL_CAPSULE | Freq: Every day | ORAL | 0 refills | Status: DC
  Filled 2021-01-18: qty 30, fill #0
  Filled 2021-01-23: qty 30, 30d supply, fill #0

## 2021-01-19 LAB — CBC WITH DIFFERENTIAL/PLATELET
Absolute Monocytes: 557 cells/uL (ref 200–950)
Basophils Absolute: 19 cells/uL (ref 0–200)
Basophils Relative: 0.3 %
Eosinophils Absolute: 128 cells/uL (ref 15–500)
Eosinophils Relative: 2 %
HCT: 42.1 % (ref 35.0–45.0)
Hemoglobin: 14.2 g/dL (ref 11.7–15.5)
Lymphs Abs: 1978 cells/uL (ref 850–3900)
MCH: 31.8 pg (ref 27.0–33.0)
MCHC: 33.7 g/dL (ref 32.0–36.0)
MCV: 94.2 fL (ref 80.0–100.0)
MPV: 9.9 fL (ref 7.5–12.5)
Monocytes Relative: 8.7 %
Neutro Abs: 3718 cells/uL (ref 1500–7800)
Neutrophils Relative %: 58.1 %
Platelets: 269 10*3/uL (ref 140–400)
RBC: 4.47 10*6/uL (ref 3.80–5.10)
RDW: 12.1 % (ref 11.0–15.0)
Total Lymphocyte: 30.9 %
WBC: 6.4 10*3/uL (ref 3.8–10.8)

## 2021-01-19 LAB — COMPREHENSIVE METABOLIC PANEL
AG Ratio: 1.3 (calc) (ref 1.0–2.5)
ALT: 31 U/L — ABNORMAL HIGH (ref 6–29)
AST: 23 U/L (ref 10–30)
Albumin: 4.4 g/dL (ref 3.6–5.1)
Alkaline phosphatase (APISO): 89 U/L (ref 31–125)
BUN: 22 mg/dL (ref 7–25)
CO2: 26 mmol/L (ref 20–32)
Calcium: 9.3 mg/dL (ref 8.6–10.2)
Chloride: 101 mmol/L (ref 98–110)
Creat: 0.89 mg/dL (ref 0.50–1.10)
Globulin: 3.3 g/dL (calc) (ref 1.9–3.7)
Glucose, Bld: 89 mg/dL (ref 65–99)
Potassium: 4 mmol/L (ref 3.5–5.3)
Sodium: 136 mmol/L (ref 135–146)
Total Bilirubin: 0.5 mg/dL (ref 0.2–1.2)
Total Protein: 7.7 g/dL (ref 6.1–8.1)

## 2021-01-19 LAB — LIPID PANEL
Cholesterol: 157 mg/dL (ref <200)
HDL: 47 mg/dL — ABNORMAL LOW (ref >=50)
LDL Cholesterol (Calc): 91 mg/dL (calc)
Non-HDL Cholesterol (Calc): 110 mg/dL (calc) (ref <130)
Total CHOL/HDL Ratio: 3.3 (calc) (ref <5.0)
Triglycerides: 98 mg/dL (ref <150)

## 2021-01-19 LAB — HCG, SERUM, QUALITATIVE: Preg, Serum: NEGATIVE

## 2021-01-23 ENCOUNTER — Other Ambulatory Visit (HOSPITAL_BASED_OUTPATIENT_CLINIC_OR_DEPARTMENT_OTHER): Payer: Self-pay

## 2021-01-29 ENCOUNTER — Encounter: Payer: Self-pay | Admitting: Dermatology

## 2021-01-29 NOTE — Progress Notes (Signed)
   Follow-Up Visit   Subjective  Sara Odom is a 25 y.o. female who presents for the following: Acne (Isotretinoin - 31 day follow up).  Acne Location: Dominantly face Duration:  Quality: Improved Associated Signs/Symptoms: Modifying Factors: [place] Severity:  Timing: Context:   Objective  Well appearing patient in no apparent distress; mood and affect are within normal limits. Objective  Right Buccal Cheek: First month isotretinoin, good tolerance, no mood, compliant with I pledge, already more than 50% clearing of inflammatory papules.    A focused examination was performed including Head and neck.. Relevant physical exam findings are noted in the Assessment and Plan.   Assessment & Plan    Acne vulgaris Right Buccal Cheek  Maintain current dose.  Ultraviolet protection.  She knows she can call me with any questions or problems.  Recheck 1 month.  CBC with Differential/Platelet - Right Buccal Cheek  Comprehensive metabolic panel - Right Buccal Cheek  Lipid panel - Right Buccal Cheek  hCG, serum, qualitative - Right Buccal Cheek  ISOtretinoin (ACCUTANE) 40 MG capsule - Right Buccal Cheek  Encounter for therapeutic drug monitoring  Other Related Procedures CBC with Differential/Platelet Comprehensive metabolic panel Lipid panel hCG, serum, qualitative  Reordered Medications ISOtretinoin (ACCUTANE) 40 MG capsule      I, Janalyn Harder, MD, have reviewed all documentation for this visit.  The documentation on 01/29/21 for the exam, diagnosis, procedures, and orders are all accurate and complete.

## 2021-02-19 ENCOUNTER — Other Ambulatory Visit (HOSPITAL_BASED_OUTPATIENT_CLINIC_OR_DEPARTMENT_OTHER): Payer: Self-pay

## 2021-02-19 ENCOUNTER — Encounter: Payer: Self-pay | Admitting: Dermatology

## 2021-02-19 ENCOUNTER — Other Ambulatory Visit: Payer: Self-pay

## 2021-02-19 ENCOUNTER — Ambulatory Visit (INDEPENDENT_AMBULATORY_CARE_PROVIDER_SITE_OTHER): Payer: No Typology Code available for payment source | Admitting: Dermatology

## 2021-02-19 DIAGNOSIS — Z5181 Encounter for therapeutic drug level monitoring: Secondary | ICD-10-CM | POA: Diagnosis not present

## 2021-02-19 DIAGNOSIS — L700 Acne vulgaris: Secondary | ICD-10-CM | POA: Diagnosis not present

## 2021-02-19 MED ORDER — ISOTRETINOIN 40 MG PO CAPS
ORAL_CAPSULE | Freq: Every day | ORAL | 0 refills | Status: DC
  Filled 2021-02-19: qty 30, fill #0
  Filled 2021-02-22: qty 30, 30d supply, fill #0

## 2021-02-20 LAB — PREGNANCY, URINE: Preg Test, Ur: NEGATIVE

## 2021-02-22 ENCOUNTER — Other Ambulatory Visit (HOSPITAL_BASED_OUTPATIENT_CLINIC_OR_DEPARTMENT_OTHER): Payer: Self-pay

## 2021-03-03 ENCOUNTER — Encounter: Payer: Self-pay | Admitting: Dermatology

## 2021-03-03 NOTE — Progress Notes (Signed)
   Follow-Up Visit   Subjective  Sara Odom is a 25 y.o. female who presents for the following: Acne (Isotret. F/u).  Acne Location: predominantly face Duration:  Quality: Improved but not completely clear Associated Signs/Symptoms: Modifying Factors: Isotretinoin Severity:  Timing: Context:   Objective  Well appearing patient in no apparent distress; mood and affect are within normal limits. Objective  Right Buccal Cheek: Much improved but not completely clear with some mostly superficial inflammatory papules, PIH.    A focused examination was performed including Head and neck. Relevant physical exam findings are noted in the Assessment and Plan.   Assessment & Plan    Acne vulgaris Right Buccal Cheek  Continue same dose isotretinoin.  Maintain I pledge compliance.  Ultraviolet protection.  Call with any questions or problems.  Pregnancy, urine - Right Buccal Cheek  ISOtretinoin (ACCUTANE) 40 MG capsule - Right Buccal Cheek  Encounter for therapeutic drug monitoring  Other Related Procedures Pregnancy, urine  Reordered Medications ISOtretinoin (ACCUTANE) 40 MG capsule      I, Janalyn Harder, MD, have reviewed all documentation for this visit.  The documentation on 03/03/21 for the exam, diagnosis, procedures, and orders are all accurate and complete.

## 2021-03-26 ENCOUNTER — Encounter: Payer: No Typology Code available for payment source | Admitting: Family Medicine

## 2021-03-28 ENCOUNTER — Ambulatory Visit (INDEPENDENT_AMBULATORY_CARE_PROVIDER_SITE_OTHER): Payer: No Typology Code available for payment source | Admitting: Dermatology

## 2021-03-28 ENCOUNTER — Other Ambulatory Visit (HOSPITAL_BASED_OUTPATIENT_CLINIC_OR_DEPARTMENT_OTHER): Payer: Self-pay

## 2021-03-28 ENCOUNTER — Other Ambulatory Visit: Payer: Self-pay

## 2021-03-28 DIAGNOSIS — Z5181 Encounter for therapeutic drug level monitoring: Secondary | ICD-10-CM | POA: Diagnosis not present

## 2021-03-28 DIAGNOSIS — L700 Acne vulgaris: Secondary | ICD-10-CM

## 2021-03-28 MED ORDER — ISOTRETINOIN 40 MG PO CAPS
ORAL_CAPSULE | Freq: Every day | ORAL | 0 refills | Status: AC
  Filled 2021-03-28 – 2021-03-30 (×2): qty 30, 30d supply, fill #0

## 2021-03-29 LAB — PREGNANCY, URINE: Preg Test, Ur: NEGATIVE

## 2021-03-30 ENCOUNTER — Other Ambulatory Visit (HOSPITAL_BASED_OUTPATIENT_CLINIC_OR_DEPARTMENT_OTHER): Payer: Self-pay

## 2021-04-02 ENCOUNTER — Encounter: Payer: Self-pay | Admitting: Dermatology

## 2021-04-02 NOTE — Progress Notes (Signed)
   Follow-Up Visit   Subjective  Sara Odom is a 25 y.o. female who presents for the following: Acne (Follow up isotretinoin. ).  Acne Location:  Duration:  Quality: Much improved Associated Signs/Symptoms: Modifying Factors: Isotretinoin Severity:  Timing: Context:   Objective  Well appearing patient in no apparent distress; mood and affect are within normal limits. Head - Anterior (Face) Slightly over half way to [place] target dose, 80% improvement and active acne with essentially no deep cysts, pigmentation appears to be improving.  Normal mood.  Moderate cheilitis.  Compliant with I pledge.    A focused examination was performed including head and neck.. Relevant physical exam findings are noted in the Assessment and Plan.   Assessment & Plan    Acne vulgaris Head - Anterior (Face)  Maintain current isotretinoin dosage.  Maintain I pledge compliance.  Ultraviolet protection.  Call with any questions or problems.  Pregnancy, urine - Head - Anterior (Face)  ISOtretinoin (ACCUTANE) 40 MG capsule - Head - Anterior (Face) TAKE 1 CAPSULE (40 MG TOTAL) BY MOUTH DAILY.  Encounter for therapeutic drug monitoring  Related Medications ISOtretinoin (ACCUTANE) 40 MG capsule TAKE 1 CAPSULE (40 MG TOTAL) BY MOUTH DAILY.      I, Janalyn Harder, MD, have reviewed all documentation for this visit.  The documentation on 04/02/21 for the exam, diagnosis, procedures, and orders are all accurate and complete.

## 2021-04-04 ENCOUNTER — Ambulatory Visit (INDEPENDENT_AMBULATORY_CARE_PROVIDER_SITE_OTHER): Payer: No Typology Code available for payment source | Admitting: Family Medicine

## 2021-04-04 ENCOUNTER — Encounter: Payer: Self-pay | Admitting: Family Medicine

## 2021-04-04 ENCOUNTER — Other Ambulatory Visit: Payer: Self-pay

## 2021-04-04 ENCOUNTER — Other Ambulatory Visit (HOSPITAL_COMMUNITY)
Admission: RE | Admit: 2021-04-04 | Discharge: 2021-04-04 | Disposition: A | Discharge status: Home / Self Care | Payer: No Typology Code available for payment source | Source: Ambulatory Visit | Attending: Family Medicine | Admitting: Family Medicine

## 2021-04-04 VITALS — BP 102/68 | HR 99 | Temp 98.2°F | Ht 66.0 in | Wt 155.2 lb

## 2021-04-04 DIAGNOSIS — Z113 Encounter for screening for infections with a predominantly sexual mode of transmission: Secondary | ICD-10-CM | POA: Diagnosis not present

## 2021-04-04 DIAGNOSIS — Z0000 Encounter for general adult medical examination without abnormal findings: Secondary | ICD-10-CM

## 2021-04-04 DIAGNOSIS — Z1159 Encounter for screening for other viral diseases: Secondary | ICD-10-CM | POA: Diagnosis not present

## 2021-04-04 LAB — CBC
HCT: 41.2 % (ref 36.0–46.0)
Hemoglobin: 14.3 g/dL (ref 12.0–15.0)
MCHC: 34.8 g/dL (ref 30.0–36.0)
MCV: 91.3 fl (ref 78.0–100.0)
Platelets: 268 10*3/uL (ref 150.0–400.0)
RBC: 4.51 Mil/uL (ref 3.87–5.11)
RDW: 12.1 % (ref 11.5–15.5)
WBC: 7.5 10*3/uL (ref 4.0–10.5)

## 2021-04-04 LAB — COMPREHENSIVE METABOLIC PANEL
ALT: 26 U/L (ref 0–35)
AST: 23 U/L (ref 0–37)
Albumin: 4.3 g/dL (ref 3.5–5.2)
Alkaline Phosphatase: 111 U/L (ref 39–117)
BUN: 11 mg/dL (ref 6–23)
CO2: 26 mEq/L (ref 19–32)
Calcium: 9.2 mg/dL (ref 8.4–10.5)
Chloride: 103 mEq/L (ref 96–112)
Creatinine, Ser: 0.87 mg/dL (ref 0.40–1.20)
GFR: 93.43 mL/min (ref >60.00)
Glucose, Bld: 102 mg/dL — ABNORMAL HIGH (ref 70–99)
Potassium: 4.2 mEq/L (ref 3.5–5.1)
Sodium: 137 mEq/L (ref 135–145)
Total Bilirubin: 0.6 mg/dL (ref 0.2–1.2)
Total Protein: 7.8 g/dL (ref 6.0–8.3)

## 2021-04-04 LAB — LIPID PANEL
Cholesterol: 160 mg/dL (ref 0–200)
HDL: 28.6 mg/dL — ABNORMAL LOW (ref >39.00)
NonHDL: 131
Total CHOL/HDL Ratio: 6
Triglycerides: 246 mg/dL — ABNORMAL HIGH (ref 0.0–149.0)
VLDL: 49.2 mg/dL — ABNORMAL HIGH (ref 0.0–40.0)

## 2021-04-04 LAB — LDL CHOLESTEROL, DIRECT: Direct LDL: 110 mg/dL

## 2021-04-04 NOTE — Progress Notes (Signed)
Chief Complaint  Patient presents with   Annual Exam   Eye Pain    Left eye pain for one day     Well Woman Sara Odom is here for a complete physical.   Her last physical was >1 year ago.  Current diet: in general, a "healthy" diet. Current exercise: lifting weights. Fatigue out of ordinary? No Seatbelt? Yes  Health Maintenance Pap/HPV- Yes Tetanus- Yes HIV screening- Yes Hep C screening- No  Past Medical History:  Diagnosis Date   Acne    Depression      Past Surgical History:  Procedure Laterality Date   NO PAST SURGERIES      Medications  Current Outpatient Medications on File Prior to Visit  Medication Sig Dispense Refill   ISOtretinoin (ACCUTANE) 40 MG capsule TAKE 1 CAPSULE (40 MG TOTAL) BY MOUTH DAILY. 30 capsule 0    Allergies Allergies  Allergen Reactions   Shellfish Allergy    Bactrim [Sulfamethoxazole-Trimethoprim]     RASH HIVES   Dapsone     Rash per patient 2021    Review of Systems: Constitutional:  no unexpected weight changes Eye:  no recent significant change in vision Ear/Nose/Mouth/Throat:  Ears:  no tinnitus or vertigo and no recent change in hearing Nose/Mouth/Throat:  no complaints of nasal congestion, no sore throat Cardiovascular: no chest pain Respiratory:  no cough and no shortness of breath Gastrointestinal:  no abdominal pain, no change in bowel habits GU:  Female: negative for dysuria or pelvic pain Musculoskeletal/Extremities:  no pain of the joints Integumentary (Skin/Breast):  no abnormal skin lesions reported Neurologic:  no headaches Endocrine:  denies fatigue Hematologic/Lymphatic:  No areas of easy bleeding  Exam BP 102/68   Pulse 99   Temp 98.2 F (36.8 C) (Oral)   Ht 5\' 6"  (1.676 m)   Wt 155 lb 4 oz (70.4 kg)   SpO2 98%   BMI 25.06 kg/m  General:  well developed, well nourished, in no apparent distress Skin:  no significant moles, warts, or growths Head:  no masses, lesions, or  tenderness Eyes:  pupils equal and round, sclera anicteric without injection Ears:  canals without lesions, TMs shiny without retraction, no obvious effusion, no erythema Nose:  nares patent, septum midline, mucosa normal, and no drainage or sinus tenderness Throat/Pharynx:  lips and gingiva without lesion; tongue and uvula midline; non-inflamed pharynx; no exudates or postnasal drainage Neck: neck supple without adenopathy, thyromegaly, or masses Lungs:  clear to auscultation, breath sounds equal bilaterally, no respiratory distress Cardio:  regular rate and rhythm, no bruits, no LE edema Abdomen:  abdomen soft, nontender; bowel sounds normal; no masses or organomegaly Genital: Defer to GYN Musculoskeletal:  symmetrical muscle groups noted without atrophy or deformity Extremities:  no clubbing, cyanosis, or edema, no deformities, no skin discoloration Neuro:  gait normal; deep tendon reflexes normal and symmetric Psych: well oriented with normal range of affect and appropriate judgment/insight  Assessment and Plan  Well adult exam - Plan: CBC, Comprehensive metabolic panel, Lipid panel  Encounter for hepatitis C screening test for low risk patient - Plan: Hepatitis C antibody  Routine screening for STI (sexually transmitted infection) - Plan: Urine cytology ancillary only([place])   Well 25 y.o. female. Counseled on diet and exercise. Other orders as above. Patient has been "lazy" on getting her COVID booster and plans on doing so. Follow up in 1 yr or prn. The patient voiced understanding and agreement to the plan.  25 [place],  DO 04/04/21 12:01 PM

## 2021-04-04 NOTE — Patient Instructions (Signed)
Give us 2-3 business days to get the results of your labs back.   Keep the diet clean and stay active.  Let us know if you need anything. 

## 2021-04-05 ENCOUNTER — Encounter: Payer: Self-pay | Admitting: Physician Assistant

## 2021-04-05 ENCOUNTER — Telehealth: Payer: No Typology Code available for payment source | Admitting: Physician Assistant

## 2021-04-05 ENCOUNTER — Other Ambulatory Visit (HOSPITAL_BASED_OUTPATIENT_CLINIC_OR_DEPARTMENT_OTHER): Payer: Self-pay

## 2021-04-05 ENCOUNTER — Other Ambulatory Visit: Payer: Self-pay | Admitting: Family Medicine

## 2021-04-05 DIAGNOSIS — H01005 Unspecified blepharitis left lower eyelid: Secondary | ICD-10-CM

## 2021-04-05 LAB — URINE CYTOLOGY ANCILLARY ONLY
Chlamydia: POSITIVE — AB
Comment: NEGATIVE
Comment: NORMAL
Neisseria Gonorrhea: NEGATIVE

## 2021-04-05 LAB — HEPATITIS C ANTIBODY
Hepatitis C Ab: NONREACTIVE
SIGNAL TO CUT-OFF: 0.09 (ref <1.00)

## 2021-04-05 MED ORDER — AMOXICILLIN-POT CLAVULANATE 875-125 MG PO TABS
1.0000 | ORAL_TABLET | Freq: Two times a day (BID) | ORAL | 0 refills | Status: DC
  Filled 2021-04-05: qty 14, 7d supply, fill #0

## 2021-04-05 MED ORDER — DOXYCYCLINE HYCLATE 100 MG PO TABS
100.0000 mg | ORAL_TABLET | Freq: Two times a day (BID) | ORAL | 0 refills | Status: AC
  Filled 2021-04-05: qty 14, 7d supply, fill #0

## 2021-04-05 NOTE — Progress Notes (Signed)
[name] are scheduled for a virtual visit with your provider today.    Just as we do with appointments in the office, we must obtain your consent to participate.  Your consent will be active for this visit and any virtual visit you may have with one of our providers in the next 365 days.    If you have a MyChart account, I can also send a copy of this consent to you electronically.  All virtual visits are billed to your insurance company just like a traditional visit in the office.  As this is a virtual visit, video technology does not allow for your provider to perform a traditional examination.  This may limit your provider's ability to fully assess your condition.  If your provider identifies any concerns that need to be evaluated in person or the need to arrange testing such as labs, EKG, etc, we will make arrangements to do so.    Although advances in technology are sophisticated, we cannot ensure that it will always work on either your end or our end.  If the connection with a video visit is poor, we may have to switch to a telephone visit.  With either a video or telephone visit, we are not always able to ensure that we have a secure connection.   I need to obtain your verbal consent now.   Are you willing to proceed with your visit today?   Sara Odom has provided verbal consent on 04/05/2021 for a virtual visit (video or telephone).   Margaretann Loveless, PA-C 04/05/2021  2:57 PM  Virtual Visit Consent   [name], you are scheduled for a virtual visit with a [facility] provider today.     Just as with appointments in the office, your consent must be obtained to participate.  Your consent will be active for this visit and any virtual visit you may have with one of our providers in the next 365 days.     If you have a MyChart account, a copy of this consent can be sent to you electronically.  All virtual visits are billed to your  insurance company just like a traditional visit in the office.    As this is a virtual visit, video technology does not allow for your provider to perform a traditional examination.  This may limit your provider's ability to fully assess your condition.  If your provider identifies any concerns that need to be evaluated in person or the need to arrange testing (such as labs, EKG, etc.), we will make arrangements to do so.     Although advances in technology are sophisticated, we cannot ensure that it will always work on either your end or our end.  If the connection with a video visit is poor, the visit may have to be switched to a telephone visit.  With either a video or telephone visit, we are not always able to ensure that we have a secure connection.     I need to obtain your verbal consent now.   Are you willing to proceed with your visit today?    Sara Odom has provided verbal consent on 04/05/2021 for a virtual visit (video or telephone).   Margaretann Loveless, PA-C   Date: 04/05/2021 2:57 PM   Virtual Visit via Video Note   Sara Odom, connected [email or web address]@ ([id number], [birth year]) on 04/05/21 at  2:45 PM EDT by a video-enabled telemedicine application and verified that I am  speaking with the correct person using two identifiers.  Location: Patient: Virtual Visit Location Patient: Home Provider: Virtual Visit Location Provider: Home Office   I discussed the limitations of evaluation and management by telemedicine and the availability of in person appointments. The patient expressed understanding and agreed to proceed.    History of Present Illness: Sara Odom is a 25 y.o. who identifies as a female who was assigned female at birth, and is being seen today for swelling of left eyelid and left side of face.  HPI: Eye Problem  The left eye is affected. This is a new problem. The current episode started today. The problem occurs constantly.  The problem has been rapidly worsening. There was no injury mechanism. The pain is at a severity of 3/10. The pain is mild. There is No known exposure to pink eye. She Does not wear contacts. Associated symptoms include blurred vision and eye redness. Pertinent negatives include no eye discharge, double vision, fever, foreign body sensation, itching, nausea, photophobia or vomiting. She has tried nothing for the symptoms.   Problems: There are no problems to display for this patient.   Allergies:  Allergies  Allergen Reactions   Shellfish Allergy    Bactrim [Sulfamethoxazole-Trimethoprim]     RASH HIVES   Dapsone     Rash per patient 2021   Medications:  Current Outpatient Medications:    amoxicillin-clavulanate (AUGMENTIN) 875-125 MG tablet, Take 1 tablet by mouth 2 (two) times daily., Disp: 14 tablet, Rfl: 0   ISOtretinoin (ACCUTANE) 40 MG capsule, TAKE 1 CAPSULE (40 MG TOTAL) BY MOUTH DAILY., Disp: 30 capsule, Rfl: 0  Observations/Objective: Patient is well-developed, well-nourished in no acute distress.  Resting comfortably  at home.  Head is normocephalic, atraumatic.  No labored breathing.  Speech is clear and coherent with logical content.  Patient is alert and oriented at baseline.  There is redness and swelling noted of the left lower eyelid. Also mild swelling noted into the left side of the nose and in the left cheek  Assessment and Plan: 1. Blepharitis of left lower eyelid, unspecified type - amoxicillin-clavulanate (AUGMENTIN) 875-125 MG tablet; Take 1 tablet by mouth 2 (two) times daily.  Dispense: 14 tablet; Refill: 0 - Suspect blepharitis vs early orbital cellulitis (orbital cellulitis less likely as patient has had no other URI symptoms, no fever, chills, nausea, vomiting, myalgias, or painful eye movement) - Will treat with oral antibiotics (Augmentin) due to swelling - Advised may use warm compresses - Wash eyelids with gentle shampoo - Seek in person evaluation if  symptoms worsen or fail to improve  Follow Up Instructions: I discussed the assessment and treatment plan with the patient. The patient was provided an opportunity to ask questions and all were answered. The patient agreed with the plan and demonstrated an understanding of the instructions.  A copy of instructions were sent to the patient via MyChart.  The patient was advised to call back or seek an in-person evaluation if the symptoms worsen or if the condition fails to improve as anticipated.  Time:  I spent 13 minutes with the patient via telehealth technology discussing the above problems/concerns.    Margaretann Loveless, PA-C

## 2021-04-05 NOTE — Patient Instructions (Signed)
  [name], thank you for joining Margaretann Loveless, PA-C for today's virtual visit.  While this provider is not your primary care provider (PCP), if your PCP is located in our provider database this encounter information will be shared with them immediately following your visit.  Consent: (Patient) Sara Odom provided verbal consent for this virtual visit at the beginning of the encounter.  Current Medications:  Current Outpatient Medications:    amoxicillin-clavulanate (AUGMENTIN) 875-125 MG tablet, Take 1 tablet by mouth 2 (two) times daily., Disp: 14 tablet, Rfl: 0   ISOtretinoin (ACCUTANE) 40 MG capsule, TAKE 1 CAPSULE (40 MG TOTAL) BY MOUTH DAILY., Disp: 30 capsule, Rfl: 0   Medications ordered in this encounter:  Meds ordered this encounter  Medications   amoxicillin-clavulanate (AUGMENTIN) 875-125 MG tablet    Sig: Take 1 tablet by mouth 2 (two) times daily.    Dispense:  14 tablet    Refill:  0    Order Specific Question:   Supervising Provider    Answer:   Hyacinth Meeker, BRIAN [[id number]]     *If you need refills on other medications prior to your next appointment, please contact your pharmacy*  Follow-Up: Call back or seek an in-person evaluation if the symptoms worsen or if the condition fails to improve as anticipated.  If you have been instructed to have an in-person evaluation today at a local Urgent Care facility, please use the link below. It will take you to a list of all of our available [facility], including address, phone number and hours of operation. Please do not delay care.  [facility]  If you or a family member do not have a primary care provider, use the link below to schedule a visit and establish care. When you choose a [facility] primary care physician or advanced practice provider, you gain a long-term partner in health. Find a Primary Care Provider  Learn more about [facility]'s in-office and  virtual care options: [facility] - Get Care Now

## 2021-04-06 ENCOUNTER — Other Ambulatory Visit: Payer: Self-pay | Admitting: Family Medicine

## 2021-04-06 ENCOUNTER — Ambulatory Visit: Payer: No Typology Code available for payment source | Admitting: Family Medicine

## 2021-04-06 DIAGNOSIS — E785 Hyperlipidemia, unspecified: Secondary | ICD-10-CM

## 2021-04-30 ENCOUNTER — Ambulatory Visit (INDEPENDENT_AMBULATORY_CARE_PROVIDER_SITE_OTHER): Payer: No Typology Code available for payment source | Admitting: Dermatology

## 2021-04-30 ENCOUNTER — Other Ambulatory Visit: Payer: Self-pay

## 2021-04-30 ENCOUNTER — Other Ambulatory Visit (HOSPITAL_BASED_OUTPATIENT_CLINIC_OR_DEPARTMENT_OTHER): Payer: Self-pay

## 2021-04-30 DIAGNOSIS — L700 Acne vulgaris: Secondary | ICD-10-CM

## 2021-04-30 DIAGNOSIS — Z5181 Encounter for therapeutic drug level monitoring: Secondary | ICD-10-CM

## 2021-04-30 MED ORDER — ISOTRETINOIN 40 MG PO CAPS
40.0000 mg | ORAL_CAPSULE | Freq: Every day | ORAL | 0 refills | Status: DC
Start: 2021-04-30 — End: 2021-05-30
  Filled 2021-04-30 – 2021-05-02 (×2): qty 30, 30d supply, fill #0

## 2021-05-01 LAB — PREGNANCY, URINE: Preg Test, Ur: NEGATIVE

## 2021-05-02 ENCOUNTER — Other Ambulatory Visit: Payer: Self-pay | Admitting: Dermatology

## 2021-05-02 ENCOUNTER — Other Ambulatory Visit (HOSPITAL_BASED_OUTPATIENT_CLINIC_OR_DEPARTMENT_OTHER): Payer: Self-pay

## 2021-05-02 DIAGNOSIS — Z5181 Encounter for therapeutic drug level monitoring: Secondary | ICD-10-CM

## 2021-05-02 DIAGNOSIS — L700 Acne vulgaris: Secondary | ICD-10-CM

## 2021-05-03 ENCOUNTER — Other Ambulatory Visit (HOSPITAL_BASED_OUTPATIENT_CLINIC_OR_DEPARTMENT_OTHER): Payer: Self-pay

## 2021-05-04 ENCOUNTER — Other Ambulatory Visit (HOSPITAL_BASED_OUTPATIENT_CLINIC_OR_DEPARTMENT_OTHER): Payer: Self-pay

## 2021-05-07 ENCOUNTER — Other Ambulatory Visit: Payer: Self-pay

## 2021-05-07 ENCOUNTER — Other Ambulatory Visit (HOSPITAL_BASED_OUTPATIENT_CLINIC_OR_DEPARTMENT_OTHER): Payer: Self-pay

## 2021-05-07 ENCOUNTER — Encounter: Payer: Self-pay | Admitting: Family Medicine

## 2021-05-07 ENCOUNTER — Ambulatory Visit (INDEPENDENT_AMBULATORY_CARE_PROVIDER_SITE_OTHER): Payer: No Typology Code available for payment source | Admitting: Family Medicine

## 2021-05-07 VITALS — BP 108/68 | HR 99 | Temp 98.7°F | Ht 66.0 in | Wt 160.0 lb

## 2021-05-07 DIAGNOSIS — J340 Abscess, furuncle and carbuncle of nose: Secondary | ICD-10-CM | POA: Diagnosis not present

## 2021-05-07 MED ORDER — CEPHALEXIN 500 MG PO CAPS
500.0000 mg | ORAL_CAPSULE | Freq: Three times a day (TID) | ORAL | 0 refills | Status: AC
  Filled 2021-05-07: qty 21, 7d supply, fill #0

## 2021-05-07 NOTE — Progress Notes (Addendum)
Chief Complaint  Patient presents with   nasal irritation    Sara Odom is a 25 y.o. female here for a skin complaint.  Duration: a few days Location: L nose Pruritic? No Painful? Yes Drainage? Yes New soaps/lotions/topicals/detergents? No Sick contacts? No Other associated symptoms: started big and hard, feeling a little better. No fevers Therapies tried thus far: none  Past Medical History:  Diagnosis Date   Acne    Depression     BP 108/68   Pulse 99   Temp 98.7 F (37.1 C) (Oral)   Ht 5\' 6"  (1.676 m)   Wt 160 lb (72.6 kg)   SpO2 98%   BMI 25.82 kg/m  Gen: awake, alert, appearing stated age Lungs: No accessory muscle use Skin: The left nare on the floor, there is a slightly raised and indurated area that is tender to palpation without significant erythema or ecchymosis.  I do not appreciate any fluctuance or active drainage.  There is no excessive warmth. Psych: Age appropriate judgment and insight  Furuncle of nose - Plan: cephALEXin (KEFLEX) 500 MG capsule  I think her body took care of this on its own but if things get worse she will take the above antibiotic for 7 days.  Use triple antibiotic ointment twice daily for the next 5 to 7 days for this.  This should also be preventative therapy should this arise again. F/u prn. The patient voiced understanding and agreement to the plan.  [place], DO 05/07/21 3:24 PM

## 2021-05-07 NOTE — Patient Instructions (Signed)
To prepare a bleach bath, one-fourth to one-half cup of bleach is placed in a full bathtub (about 40 gallons) of water. Bleach baths are usually taken for 5 to 10 minutes twice per week and should be followed by application of an emollient.  Use Neosporin twice daily for around a week.   If things get worse, use the antibiotic.   If this happens in the future, use Neosporin twice daily for 7-10 days.   When you do wash it, use only soap and water. Keep the area clean and dry.   Things to look out for: increasing pain not relieved by ibuprofen/acetaminophen, fevers, spreading redness, drainage of pus, or foul odor.  Let us know if you need anything.

## 2021-05-08 ENCOUNTER — Encounter: Payer: Self-pay | Admitting: Dermatology

## 2021-05-08 NOTE — Progress Notes (Signed)
   Follow-Up Visit   Subjective  Sara Odom is a 25 y.o. female who presents for the following: Acne (Patient here today for 31 day follow up).  Cystic acne Location: Mostly face Duration:  Quality:  Associated Signs/Symptoms: Modifying Factors:  Severity:  Timing: Context:   Objective  Well appearing patient in no apparent distress; mood and affect are within normal limits. Head - Anterior (Face) Deep inflammatory papules clear, some PIH.  No sunburns.  Normal mood.  Compliant with I pledge.    A focused examination was performed including head and neck. Relevant physical exam findings are noted in the Assessment and Plan.   Assessment & Plan    Acne vulgaris Head - Anterior (Face)  Will reach [place] target dose in 2 months; we will decide at that time whether this is the appropriate dose for Sara Odom.  Continued I pledge compliance.  Recheck 1 month.  She knows She can reach me with any problems.  Pregnancy, urine - Head - Anterior (Face)  ISOtretinoin (ACCUTANE) 40 MG capsule - Head - Anterior (Face) Take 1 capsule (40 mg total) by mouth daily.  Encounter for therapeutic drug monitoring  Related Procedures Pregnancy, urine      I, Janalyn Harder, MD, have reviewed all documentation for this visit.  The documentation on 05/08/21 for the exam, diagnosis, procedures, and orders are all accurate and complete.

## 2021-05-18 ENCOUNTER — Other Ambulatory Visit: Payer: No Typology Code available for payment source

## 2021-05-30 ENCOUNTER — Other Ambulatory Visit (HOSPITAL_BASED_OUTPATIENT_CLINIC_OR_DEPARTMENT_OTHER): Payer: Self-pay

## 2021-05-30 ENCOUNTER — Encounter: Payer: Self-pay | Admitting: Dermatology

## 2021-05-30 ENCOUNTER — Ambulatory Visit (INDEPENDENT_AMBULATORY_CARE_PROVIDER_SITE_OTHER): Payer: 59 | Admitting: Dermatology

## 2021-05-30 ENCOUNTER — Other Ambulatory Visit: Payer: Self-pay

## 2021-05-30 DIAGNOSIS — L700 Acne vulgaris: Secondary | ICD-10-CM

## 2021-05-30 DIAGNOSIS — Z5181 Encounter for therapeutic drug level monitoring: Secondary | ICD-10-CM | POA: Diagnosis not present

## 2021-05-30 MED ORDER — ISOTRETINOIN 40 MG PO CAPS
40.0000 mg | ORAL_CAPSULE | Freq: Every day | ORAL | 0 refills | Status: AC
  Filled 2021-05-30 – 2021-06-04 (×3): qty 30, 30d supply, fill #0

## 2021-05-31 ENCOUNTER — Other Ambulatory Visit (HOSPITAL_BASED_OUTPATIENT_CLINIC_OR_DEPARTMENT_OTHER): Payer: Self-pay

## 2021-05-31 LAB — PREGNANCY, URINE: Preg Test, Ur: NEGATIVE

## 2021-06-01 ENCOUNTER — Other Ambulatory Visit (HOSPITAL_BASED_OUTPATIENT_CLINIC_OR_DEPARTMENT_OTHER): Payer: Self-pay

## 2021-06-04 ENCOUNTER — Other Ambulatory Visit (HOSPITAL_BASED_OUTPATIENT_CLINIC_OR_DEPARTMENT_OTHER): Payer: Self-pay

## 2021-06-10 ENCOUNTER — Encounter: Payer: Self-pay | Admitting: Dermatology

## 2021-06-10 NOTE — Progress Notes (Signed)
   Follow-Up Visit   Subjective  Angela Oralia Rud is a 25 y.o. female who presents for the following: Follow-up (31 days/).  Cystic acne Location:  Duration:  Quality:  Associated Signs/Symptoms: Modifying Factors: Isotretinoin Severity:  Timing: Context:   Objective  Well appearing patient in no apparent distress; mood and affect are within normal limits. Head - Anterior (Face) All cystic acne clear, mild PIH.  Moderate cheilitis.  Compliant with I pledge.    A focused examination was performed including head and neck. Relevant physical exam findings are noted in the Assessment and Plan.   Assessment & Plan    Acne vulgaris Head - Anterior (Face)  She will reach the [place] target dose with this prescription; unless she has active acne, she will finish with this prescription.  Post treatment we will give topical arazlo.  Pregnancy, urine - Head - Anterior (Face)  Related Medications ISOtretinoin (ACCUTANE) 40 MG capsule Take 1 capsule (40 mg total) by mouth daily.  Encounter for therapeutic drug monitoring  Related Procedures Pregnancy, urine      I, Janalyn Harder, MD, have reviewed all documentation for this visit.  The documentation on 06/10/21 for the exam, diagnosis, procedures, and orders are all accurate and complete.

## 2021-07-03 ENCOUNTER — Ambulatory Visit: Payer: Self-pay

## 2022-01-31 ENCOUNTER — Encounter: Payer: Self-pay | Admitting: Family Medicine

## 2022-02-05 NOTE — Telephone Encounter (Signed)
Called Pt and Advised her to make appointment  ? Pt stated she felt like it was better now. ?

## 2022-03-04 ENCOUNTER — Telehealth: Payer: Self-pay | Admitting: Family Medicine

## 2022-03-04 NOTE — Telephone Encounter (Signed)
Pt. Scheduled

## 2022-03-04 NOTE — Telephone Encounter (Signed)
Needs an appointment with Dr. Carmelia Roller for this.

## 2022-03-04 NOTE — Telephone Encounter (Signed)
Pt called requesting to have an order put in for a TB blood test for work purposes.

## 2022-03-05 ENCOUNTER — Encounter: Payer: Self-pay | Admitting: Family Medicine

## 2022-03-05 ENCOUNTER — Ambulatory Visit (INDEPENDENT_AMBULATORY_CARE_PROVIDER_SITE_OTHER): Payer: Commercial Managed Care - PPO | Admitting: Family Medicine

## 2022-03-05 ENCOUNTER — Ambulatory Visit: Payer: 59 | Admitting: Family Medicine

## 2022-03-05 VITALS — BP 118/81 | HR 92 | Temp 98.3°F | Ht 66.0 in | Wt 171.5 lb

## 2022-03-05 DIAGNOSIS — Z111 Encounter for screening for respiratory tuberculosis: Secondary | ICD-10-CM

## 2022-03-05 DIAGNOSIS — Z0000 Encounter for general adult medical examination without abnormal findings: Secondary | ICD-10-CM

## 2022-03-05 NOTE — Progress Notes (Signed)
Chief Complaint  Patient presents with   Annual Exam    TB blood test      Well Woman Sara Odom is here for a complete physical.   Her last physical was >1 year ago.  Current diet: in general, a "healthy" diet. Current exercise: lifting wts, some cardio. Contraception? Yes Fatigue out of ordinary? No Seatbelt? Yes Advanced directive? No  Health Maintenance Pap/HPV- No Tetanus- Yes HIV screening- Yes Hep C screening- Yes  Past Medical History:  Diagnosis Date   Acne    Depression      Past Surgical History:  Procedure Laterality Date   NO PAST SURGERIES      Medications  No current outpatient medications on file prior to visit.   No current facility-administered medications on file prior to visit.     Allergies Allergies  Allergen Reactions   Shellfish Allergy    Bactrim [Sulfamethoxazole-Trimethoprim]     RASH HIVES   Dapsone     Rash per patient 2021    Review of Systems: Constitutional:  no unexpected weight changes Eye:  no recent significant change in vision Ear/Nose/Mouth/Throat:  Ears:  no tinnitus or vertigo and no recent change in hearing Nose/Mouth/Throat:  no complaints of nasal congestion, no sore throat Cardiovascular: no chest pain Respiratory:  no cough and no shortness of breath Gastrointestinal:  no abdominal pain, no change in bowel habits GU:  Female: negative for dysuria or pelvic pain Musculoskeletal/Extremities:  no pain of the joints Integumentary (Skin/Breast):  no abnormal skin lesions reported Neurologic:  no headaches Endocrine:  denies fatigue Hematologic/Lymphatic:  No areas of easy bleeding  Exam BP 118/81   Pulse 92   Temp 98.3 F (36.8 C) (Oral)   Ht 5\' 6"  (1.676 m)   Wt 171 lb 8 oz (77.8 kg)   SpO2 98%   BMI 27.68 kg/m  General:  well developed, well nourished, in no apparent distress Skin:  no significant moles, warts, or growths Head:  no masses, lesions, or tenderness Eyes:  pupils  equal and round, sclera anicteric without injection Ears:  canals without lesions, TMs shiny without retraction, no obvious effusion, no erythema Nose:  nares patent, septum midline, mucosa normal, and no drainage or sinus tenderness Throat/Pharynx:  lips and gingiva without lesion; tongue and uvula midline; non-inflamed pharynx; no exudates or postnasal drainage Neck: neck supple without adenopathy, thyromegaly, or masses Lungs:  clear to auscultation, breath sounds equal bilaterally, no respiratory distress Cardio:  regular rate and rhythm, no bruits, no LE edema Abdomen:  abdomen soft, nontender; bowel sounds normal; no masses or organomegaly Genital: Defer to GYN Musculoskeletal:  symmetrical muscle groups noted without atrophy or deformity Extremities:  no clubbing, cyanosis, or edema, no deformities, no skin discoloration Neuro:  gait normal; deep tendon reflexes normal and symmetric Psych: well oriented with normal range of affect and appropriate judgment/insight  Assessment and Plan  Well adult exam - Plan: CBC, Comprehensive metabolic panel, Lipid panel  Screening-pulmonary TB - Plan: QuantiFERON-TB Gold Plus   Well 26 y.o. female. Counseled on diet and exercise. Other orders as above. Advanced directive form requested today.  Needs Tb screen for work. GYN info provided-due for cervical cancer screening.  Follow up in 1 yr or prn. The patient voiced understanding and agreement to the plan.  22 [place], DO 03/05/22 2:31 PM

## 2022-03-05 NOTE — Patient Instructions (Addendum)
Give [place] 2-3 business days to get the results of your labs back.   Keep the diet clean and stay active.  Please get me a copy of your advanced directive form at your convenience.   Consider Powerstep insoles. There are very quality over the counter inserts. Shop around online and in stores. Dr. Margart Sickles is a cheaper alternative, though is not as high of quality.   Call Center for [facility] at [phone] for an appointment.  They are located at [address], [address], [place], [place], [address] (right across the hall from our office).  Let us know if you need anything.

## 2022-03-06 LAB — CBC
HCT: 40.2 % (ref 36.0–46.0)
Hemoglobin: 13.7 g/dL (ref 12.0–15.0)
MCHC: 34.1 g/dL (ref 30.0–36.0)
MCV: 92.2 fl (ref 78.0–100.0)
Platelets: 274 10*3/uL (ref 150.0–400.0)
RBC: 4.36 Mil/uL (ref 3.87–5.11)
RDW: 12.2 % (ref 11.5–15.5)
WBC: 7.3 10*3/uL (ref 4.0–10.5)

## 2022-03-06 LAB — COMPREHENSIVE METABOLIC PANEL
ALT: 28 U/L (ref 0–35)
AST: 22 U/L (ref 0–37)
Albumin: 4.5 g/dL (ref 3.5–5.2)
Alkaline Phosphatase: 89 U/L (ref 39–117)
BUN: 14 mg/dL (ref 6–23)
CO2: 29 mEq/L (ref 19–32)
Calcium: 9.5 mg/dL (ref 8.4–10.5)
Chloride: 99 mEq/L (ref 96–112)
Creatinine, Ser: 0.96 mg/dL (ref 0.40–1.20)
GFR: 82.49 mL/min (ref >60.00)
Glucose, Bld: 86 mg/dL (ref 70–99)
Potassium: 4.3 mEq/L (ref 3.5–5.1)
Sodium: 135 mEq/L (ref 135–145)
Total Bilirubin: 0.9 mg/dL (ref 0.2–1.2)
Total Protein: 7.7 g/dL (ref 6.0–8.3)

## 2022-03-06 LAB — LIPID PANEL
Cholesterol: 167 mg/dL (ref 0–200)
HDL: 38 mg/dL — ABNORMAL LOW (ref >39.00)
LDL Cholesterol: 97 mg/dL (ref 0–99)
NonHDL: 128.54
Total CHOL/HDL Ratio: 4
Triglycerides: 160 mg/dL — ABNORMAL HIGH (ref 0.0–149.0)
VLDL: 32 mg/dL (ref 0.0–40.0)

## 2022-03-09 LAB — QUANTIFERON-TB GOLD PLUS
Mitogen-NIL: 9.16 IU/mL
NIL: 0.05 IU/mL
QuantiFERON-TB Gold Plus: NEGATIVE
TB1-NIL: <0 IU/mL
TB2-NIL: 0 IU/mL

## 2022-03-11 IMAGING — CR DG HAND COMPLETE 3+V*R*
3 series · 3 of 3 positions shown · non-contrast
Comparison: None.

CLINICAL DATA: 24-year-old female with laceration of the dorsum of
the right hand.

EXAM:
RIGHT HAND - COMPLETE 3+ VIEW

[x hand pa right]
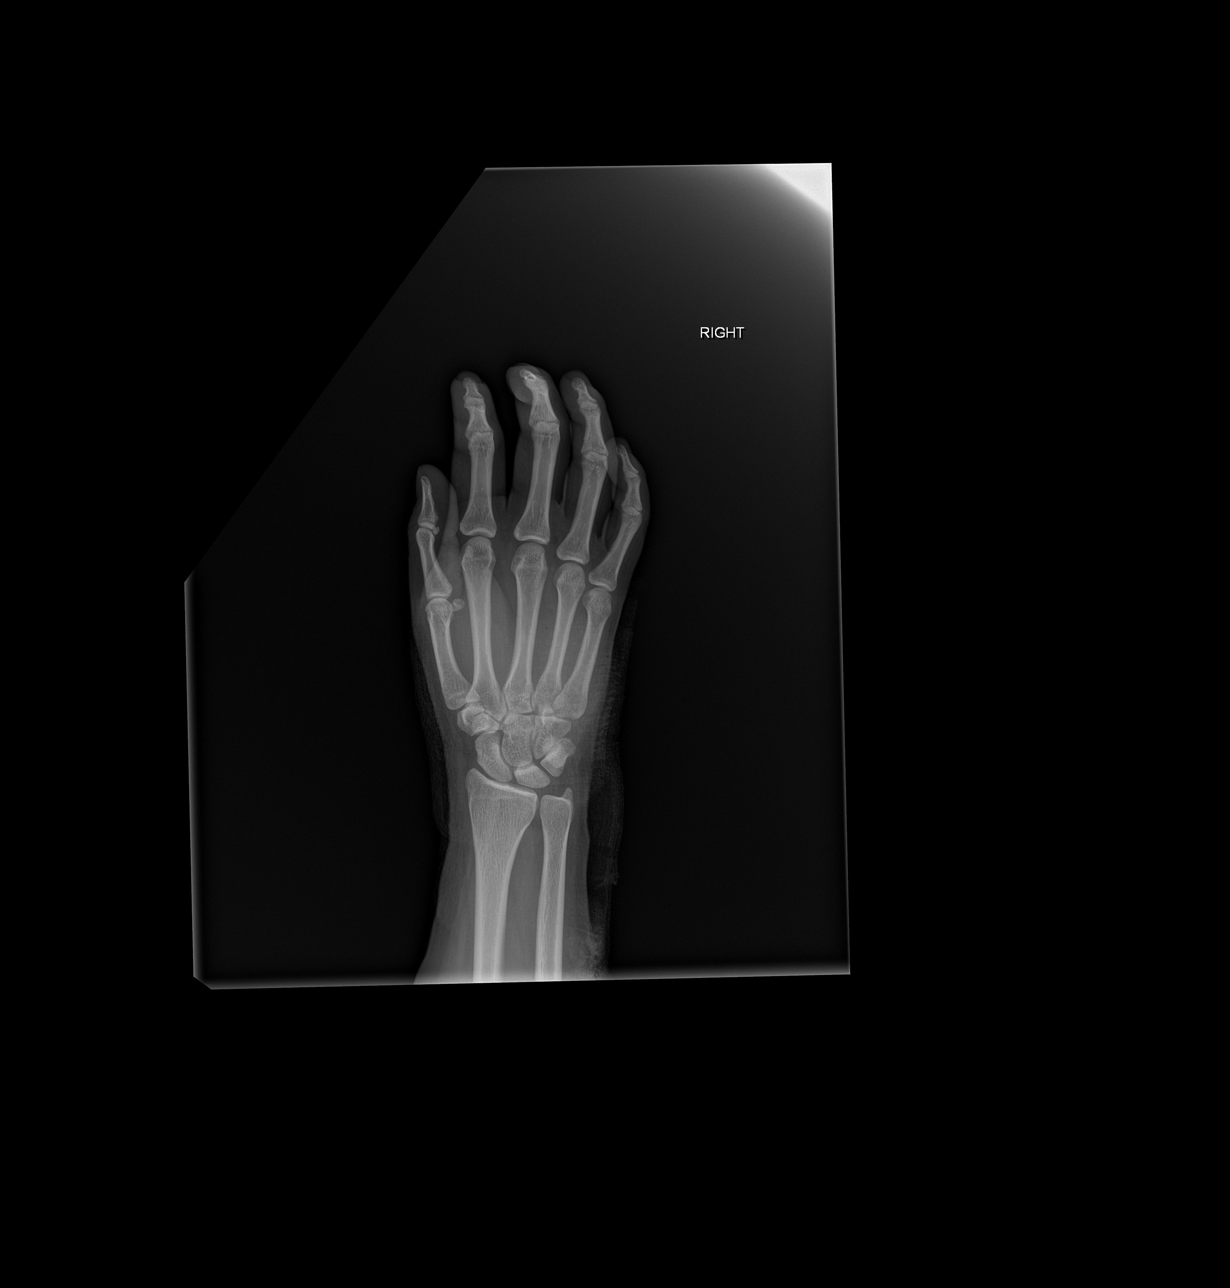

[x hand obl right]
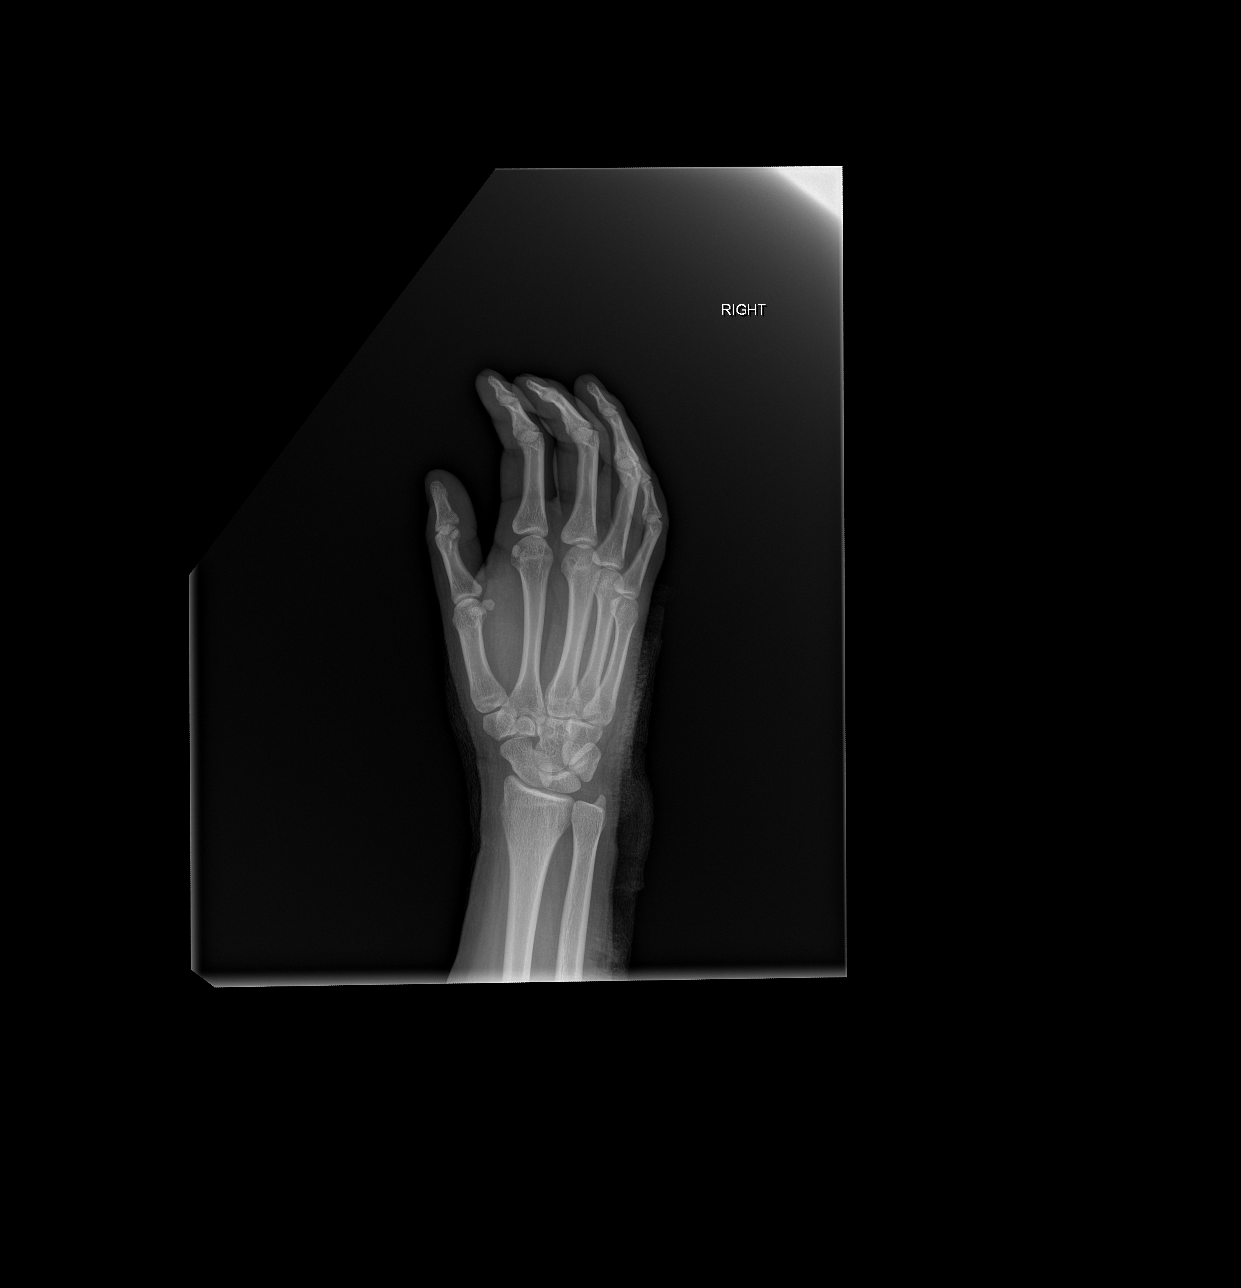

[x hand lat right]
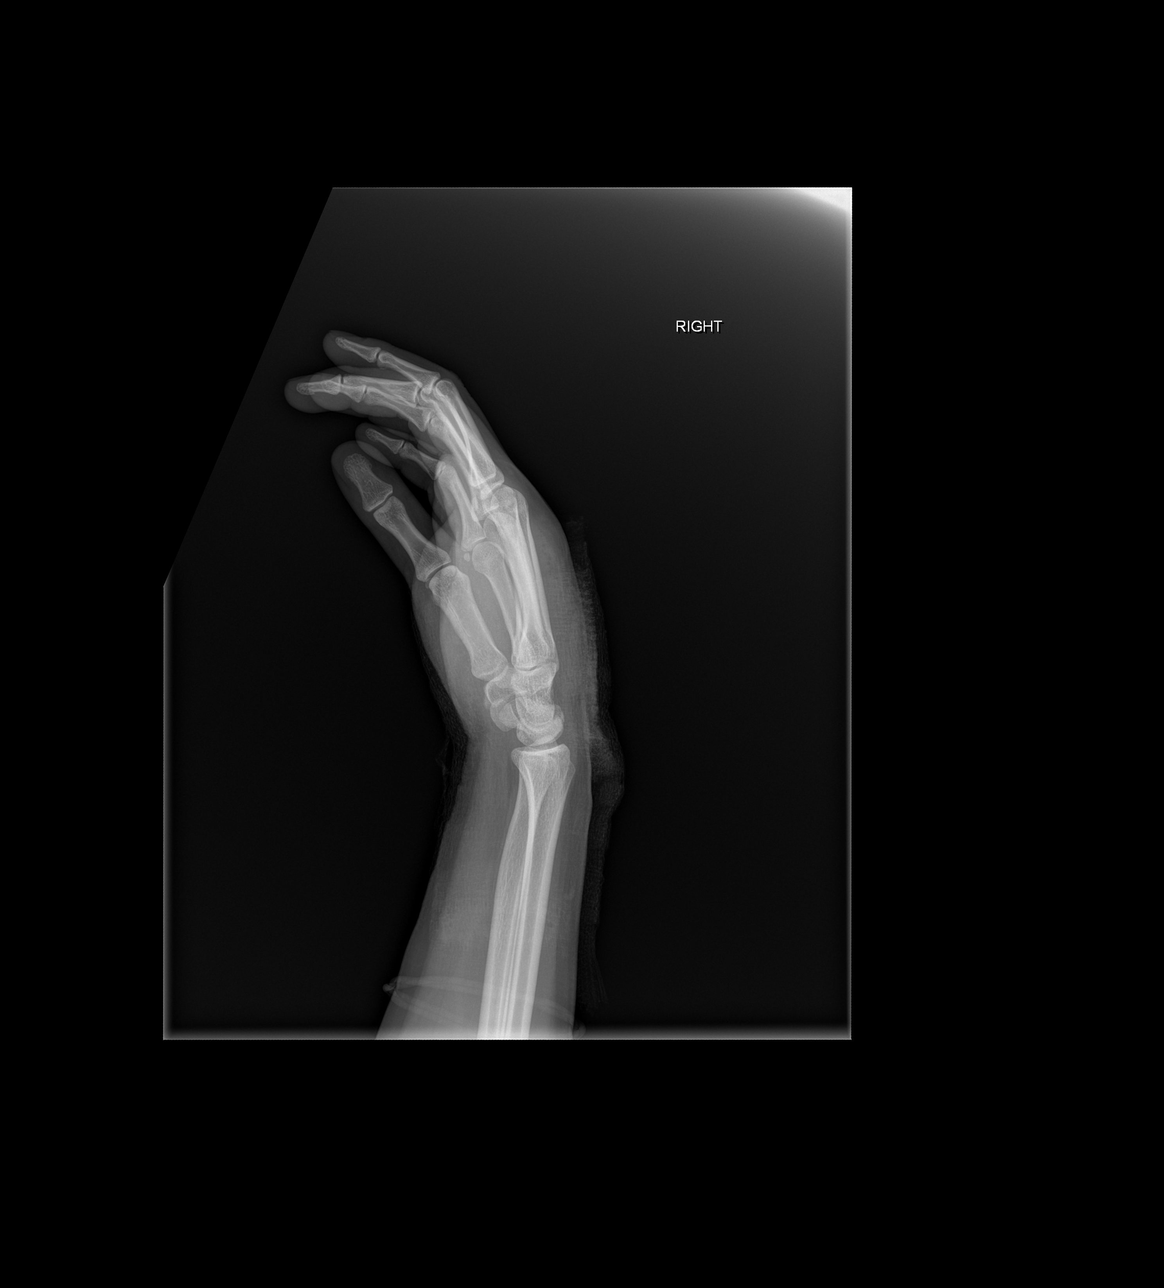

[3 of 3 positions shown; findings below may reference images not displayed]

FINDINGS: There is no acute fracture or dislocation. The bones are well
mineralized. No arthritic changes. Soft tissue swelling of the
dorsum of the hand. No radiopaque foreign object. Dressing is noted
over the hand.
IMPRESSION: No acute/traumatic osseous pathology.

## 2022-04-08 ENCOUNTER — Encounter: Payer: No Typology Code available for payment source | Admitting: Family Medicine

## 2022-04-15 ENCOUNTER — Encounter: Payer: 59 | Admitting: Family Medicine

## 2023-10-31 ENCOUNTER — Ambulatory Visit (INDEPENDENT_AMBULATORY_CARE_PROVIDER_SITE_OTHER): Payer: Commercial Managed Care - PPO | Admitting: Family Medicine

## 2023-10-31 ENCOUNTER — Encounter: Payer: Self-pay | Admitting: Family Medicine

## 2023-10-31 VITALS — BP 118/80 | HR 97 | Temp 98.0°F | Resp 16 | Ht 66.0 in | Wt 175.0 lb

## 2023-10-31 DIAGNOSIS — Z0000 Encounter for general adult medical examination without abnormal findings: Secondary | ICD-10-CM | POA: Diagnosis not present

## 2023-10-31 NOTE — Progress Notes (Signed)
Chief Complaint  Patient presents with   Back Pain    Back pain     Well Woman Sara Odom is here for a complete physical.   Her last physical was >1 year ago.  Current diet: in general, diet is fair. Current exercise: running. Contraception? Yes Fatigue out of ordinary? No Seatbelt? Yes Advanced directive? Yes  Health Maintenance Pap/HPV- Yes Tetanus- Yes HIV screening- Yes Hep C screening- Yes  Pt has been having recurrent back strains, happening every 3 mo or so. Lower right side, currently radiating to both sides. Flared up 9 d ago, usually recovers within 5 d. No inj or change in activity. Usually it was moving heavy things. She was placed on Zanaflex and a Medrol Dosepak. These have helped.  No loss of control of bowel/bladder function, neuro s/s's, redness, swelling, current bruising.   Past Medical History:  Diagnosis Date   Acne    Depression      Past Surgical History:  Procedure Laterality Date   NO PAST SURGERIES      Medications  No current outpatient medications on file prior to visit.   No current facility-administered medications on file prior to visit.     Allergies Allergies  Allergen Reactions   Shellfish Allergy    Bactrim [Sulfamethoxazole-Trimethoprim]     RASH HIVES   Dapsone     Rash per patient 2021    Review of Systems: Constitutional:  no unexpected weight changes Eye:  no recent significant change in vision Ear/Nose/Mouth/Throat:  Ears:  no tinnitus or vertigo and no recent change in hearing Nose/Mouth/Throat:  no complaints of nasal congestion, no sore throat Cardiovascular: no chest pain Respiratory:  no cough and no shortness of breath Gastrointestinal:  no abdominal pain, no change in bowel habits GU:  Female: negative for dysuria or pelvic pain Musculoskeletal/Extremities:  no new pain of the joints other than back Integumentary (Skin/Breast):  no abnormal skin lesions reported Neurologic:  no  headaches Endocrine:  denies fatigue Hematologic/Lymphatic:  No areas of easy bleeding  Exam BP 118/80   Pulse 97   Temp 98 F (36.7 C) (Oral)   Resp 16   Ht 5\' 6"  (1.676 m)   Wt 175 lb (79.4 kg)   SpO2 98%   BMI 28.25 kg/m  General:  well developed, well nourished, in no apparent distress Skin:  no significant moles, warts, or growths Head:  no masses, lesions, or tenderness Eyes:  pupils equal and round, sclera anicteric without injection Ears:  canals without lesions, TMs shiny without retraction, no obvious effusion, no erythema Nose:  nares patent, mucosa normal, and no drainage  Throat/Pharynx:  lips and gingiva without lesion; tongue and uvula midline; non-inflamed pharynx; no exudates or postnasal drainage Neck: neck supple without adenopathy, thyromegaly, or masses Lungs:  clear to auscultation, breath sounds equal bilaterally, no respiratory distress Cardio:  regular rate and rhythm, no bruits, no LE edema Abdomen:  abdomen soft, nontender; bowel sounds normal; no masses or organomegaly Genital: Defer to GYN Musculoskeletal: ttp over lateral parasp msc on R; OK hamstring ROM on both hamstrings; symmetrical muscle groups noted without atrophy or deformity Extremities:  no clubbing, cyanosis, or edema, no deformities, no skin discoloration Neuro: Negative straight leg bilaterally, 5/5 strength in the lower extremities bilaterally, gait normal; deep tendon reflexes normal and symmetric Psych: well oriented with normal range of affect and appropriate judgment/insight  Assessment and Plan  Well adult exam - Plan: CBC, Comprehensive metabolic panel, Lipid panel  Well 28 y.o. female. Counseled on diet and exercise. Advanced directive form requested today.  Other orders as above. Heat, ice, Tylenol, stretches and exercises.  If it happens again in the next 4 to 6 weeks, she will let us know and we will set her up with physical therapy. Follow up in 1 yr. The patient  voiced understanding and agreement to the plan.  [name] [place], DO 10/31/23 2:34 PM

## 2023-10-31 NOTE — Patient Instructions (Addendum)
Give [place] 2-3 business days to get the results of your labs back.   Keep the diet clean and stay active.  Please get me a copy of your advanced directive form at your convenience.   Send me a message in 4-6 weeks if the pain recurs. We will set you up with physical therapy at that point.   Heat (pad or rice pillow in microwave) over affected area, 10-15 minutes twice daily.   Ice/cold pack over area for 10-15 min twice daily.  OK to take Tylenol 1000 mg (2 extra strength tabs) or 975 mg (3 regular strength tabs) every 6 hours as needed.  Let us know if you need anything.  EXERCISES  RANGE OF MOTION (ROM) AND STRETCHING EXERCISES - Low Back Pain Most people with lower back pain will find that their symptoms get worse with excessive bending forward (flexion) or arching at the lower back (extension). The exercises that will help resolve your symptoms will focus on the opposite motion.  If you have pain, numbness or tingling which travels down into your buttocks, leg or foot, the goal of the therapy is for these symptoms to move closer to your back and eventually resolve. Sometimes, these leg symptoms will get better, but your lower back pain may worsen. This is often an indication of progress in your rehabilitation. Be very alert to any changes in your symptoms and the activities in which you participated in the 24 hours prior to the change. Sharing this information with your caregiver will allow him or her to most efficiently treat your condition. These exercises may help you when beginning to rehabilitate your injury. Your symptoms may resolve with or without further involvement from your physician, physical therapist or athletic trainer. While completing these exercises, remember:  Restoring tissue flexibility helps normal motion to return to the joints. This allows healthier, less painful movement and activity. An effective stretch should be held for at least 30 seconds. A stretch should never  be painful. You should only feel a gentle lengthening or release in the stretched tissue. FLEXION RANGE OF MOTION AND STRETCHING EXERCISES:  STRETCH - Flexion, Single Knee to Chest  Lie on a firm bed or floor with both legs extended in front of you. Keeping one leg in contact with the floor, bring your opposite knee to your chest. Hold your leg in place by either grabbing behind your thigh or at your knee. Pull until you feel a gentle stretch in your low back. Hold 30 seconds. Slowly release your grasp and repeat the exercise with the opposite side. Repeat 2 times. Complete this exercise 3 times per week.   STRETCH - Flexion, Double Knee to Chest Lie on a firm bed or floor with both legs extended in front of you. Keeping one leg in contact with the floor, bring your opposite knee to your chest. Tense your stomach muscles to support your back and then lift your other knee to your chest. Hold your legs in place by either grabbing behind your thighs or at your knees. Pull both knees toward your chest until you feel a gentle stretch in your low back. Hold 30 seconds. Tense your stomach muscles and slowly return one leg at a time to the floor. Repeat 2 times. Complete this exercise 3 times per week.   STRETCH - Low Trunk Rotation Lie on a firm bed or floor. Keeping your legs in front of you, bend your knees so they are both pointed toward the ceiling and  your feet are flat on the floor. Extend your arms out to the side. This will stabilize your upper body by keeping your shoulders in contact with the floor. Gently and slowly drop both knees together to one side until you feel a gentle stretch in your low back. Hold for 30 seconds. Tense your stomach muscles to support your lower back as you bring your knees back to the starting position. Repeat the exercise to the other side. Repeat 2 times. Complete this exercise at least 3 times per week.   EXTENSION RANGE OF MOTION AND FLEXIBILITY  EXERCISES:  STRETCH - Extension, Prone on Elbows  Lie on your stomach on the floor, a bed will be too soft. Place your palms about shoulder width apart and at the height of your head. Place your elbows under your shoulders. If this is too painful, stack pillows under your chest. Allow your body to relax so that your hips drop lower and make contact more completely with the floor. Hold this position for 30 seconds. Slowly return to lying flat on the floor. Repeat 2 times. Complete this exercise 3 times per week.   RANGE OF MOTION - Extension, Prone Press Ups Lie on your stomach on the floor, a bed will be too soft. Place your palms about shoulder width apart and at the height of your head. Keeping your back as relaxed as possible, slowly straighten your elbows while keeping your hips on the floor. You may adjust the placement of your hands to maximize your comfort. As you gain motion, your hands will come more underneath your shoulders. Hold this position 30 seconds. Slowly return to lying flat on the floor. Repeat 2 times. Complete this exercise 3 times per week.   RANGE OF MOTION- Quadruped, Neutral Spine  Assume a hands and knees position on a firm surface. Keep your hands under your shoulders and your knees under your hips. You may place padding under your knees for comfort. Drop your head and point your tailbone toward the ground below you. This will round out your lower back like an angry cat. Hold this position for 30 seconds. Slowly lift your head and release your tail bone so that your back sags into a large arch, like an old horse. Hold this position for 30 seconds. Repeat this until you feel limber in your low back. Now, find your "sweet spot." This will be the most comfortable position somewhere between the two previous positions. This is your neutral spine. Once you have found this position, tense your stomach muscles to support your low back. Hold this position for 30  seconds. Repeat 2 times. Complete this exercise 3 times per week.   STRENGTHENING EXERCISES - Low Back Sprain These exercises may help you when beginning to rehabilitate your injury. These exercises should be done near your "sweet spot." This is the neutral, low-back arch, somewhere between fully rounded and fully arched, that is your least painful position. When performed in this safe range of motion, these exercises can be used for people who have either a flexion or extension based injury. These exercises may resolve your symptoms with or without further involvement from your physician, physical therapist or athletic trainer. While completing these exercises, remember:  Muscles can gain both the endurance and the strength needed for everyday activities through controlled exercises. Complete these exercises as instructed by your physician, physical therapist or athletic trainer. Increase the resistance and repetitions only as guided. You may experience muscle soreness or fatigue, but  the pain or discomfort you are trying to eliminate should never worsen during these exercises. If this pain does worsen, stop and make certain you are following the directions exactly. If the pain is still present after adjustments, discontinue the exercise until you can discuss the trouble with your caregiver.  STRENGTHENING - Deep Abdominals, Pelvic Tilt  Lie on a firm bed or floor. Keeping your legs in front of you, bend your knees so they are both pointed toward the ceiling and your feet are flat on the floor. Tense your lower abdominal muscles to press your low back into the floor. This motion will rotate your pelvis so that your tail bone is scooping upwards rather than pointing at your feet or into the floor. With a gentle tension and even breathing, hold this position for 3 seconds. Repeat 2 times. Complete this exercise 3 times per week.   STRENGTHENING - Abdominals, Crunches  Lie on a firm bed or floor.  Keeping your legs in front of you, bend your knees so they are both pointed toward the ceiling and your feet are flat on the floor. Cross your arms over your chest. Slightly tip your chin down without bending your neck. Tense your abdominals and slowly lift your trunk high enough to just clear your shoulder blades. Lifting higher can put excessive stress on the lower back and does not further strengthen your abdominal muscles. Control your return to the starting position. Repeat 2 times. Complete this exercise 3 times per week.   STRENGTHENING - Quadruped, Opposite UE/LE Lift  Assume a hands and knees position on a firm surface. Keep your hands under your shoulders and your knees under your hips. You may place padding under your knees for comfort. Find your neutral spine and gently tense your abdominal muscles so that you can maintain this position. Your shoulders and hips should form a rectangle that is parallel with the floor and is not twisted. Keeping your trunk steady, lift your right hand no higher than your shoulder and then your left leg no higher than your hip. Make sure you are not holding your breath. Hold this position for 30 seconds. Continuing to keep your abdominal muscles tense and your back steady, slowly return to your starting position. Repeat with the opposite arm and leg. Repeat 2 times. Complete this exercise 3 times per week.   STRENGTHENING - Abdominals and Quadriceps, Straight Leg Raise  Lie on a firm bed or floor with both legs extended in front of you. Keeping one leg in contact with the floor, bend the other knee so that your foot can rest flat on the floor. Find your neutral spine, and tense your abdominal muscles to maintain your spinal position throughout the exercise. Slowly lift your straight leg off the floor about 6 inches for a count of 3, making sure to not hold your breath. Still keeping your neutral spine, slowly lower your leg all the way to the  floor. Repeat this exercise with each leg 2 times. Complete this exercise 3 times per week.  POSTURE AND BODY MECHANICS CONSIDERATIONS - Low Back Sprain Keeping correct posture when sitting, standing or completing your activities will reduce the stress put on different body tissues, allowing injured tissues a chance to heal and limiting painful experiences. The following are general guidelines for improved posture.  While reading these guidelines, remember: The exercises prescribed by your provider will help you have the flexibility and strength to maintain correct postures. The correct posture provides the  best environment for your joints to work. All of your joints have less wear and tear when properly supported by a spine with good posture. This means you will experience a healthier, less painful body. Correct posture must be practiced with all of your activities, especially prolonged sitting and standing. Correct posture is as important when doing repetitive low-stress activities (typing) as it is when doing a single heavy-load activity (lifting).  RESTING POSITIONS Consider which positions are most painful for you when choosing a resting position. If you have pain with flexion-based activities (sitting, bending, stooping, squatting), choose a position that allows you to rest in a less flexed posture. You would want to avoid curling into a fetal position on your side. If your pain worsens with extension-based activities (prolonged standing, working overhead), avoid resting in an extended position such as sleeping on your stomach. Most people will find more comfort when they rest with their spine in a more neutral position, neither too rounded nor too arched. Lying on a non-sagging bed on your side with a pillow between your knees, or on your back with a pillow under your knees will often provide some relief. Keep in mind, being in any one position for a prolonged period of time, no matter how correct  your posture, can still lead to stiffness.  PROPER SITTING POSTURE In order to minimize stress and discomfort on your spine, you must sit with correct posture. Sitting with good posture should be effortless for a healthy body. Returning to good posture is a gradual process. Many people can work toward this most comfortably by using various supports until they have the flexibility and strength to maintain this posture on their own. When sitting with proper posture, your ears will fall over your shoulders and your shoulders will fall over your hips. You should use the back of the chair to support your upper back. Your lower back will be in a neutral position, just slightly arched. You may place a small pillow or folded towel at the base of your lower back for  support.  When working at a desk, create an environment that supports good, upright posture. Without extra support, muscles tire, which leads to excessive strain on joints and other tissues. Keep these recommendations in mind:  CHAIR: A chair should be able to slide under your desk when your back makes contact with the back of the chair. This allows you to work closely. The chair's height should allow your eyes to be level with the upper part of your monitor and your hands to be slightly lower than your elbows.  BODY POSITION Your feet should make contact with the floor. If this is not possible, use a foot rest. Keep your ears over your shoulders. This will reduce stress on your neck and low back.  INCORRECT SITTING POSTURES  If you are feeling tired and unable to assume a healthy sitting posture, do not slouch or slump. This puts excessive strain on your back tissues, causing more damage and pain. Healthier options include: Using more support, like a lumbar pillow. Switching tasks to something that requires you to be upright or walking. Talking a brief walk. Lying down to rest in a neutral-spine position.  PROLONGED STANDING WHILE  SLIGHTLY LEANING FORWARD  When completing a task that requires you to lean forward while standing in one place for a long time, place either foot up on a stationary 2-4 inch high object to help maintain the best posture. When both feet are on  the ground, the lower back tends to lose its slight inward curve. If this curve flattens (or becomes too large), then the back and your other joints will experience too much stress, tire more quickly, and can cause pain.  CORRECT STANDING POSTURES Proper standing posture should be assumed with all daily activities, even if they only take a few moments, like when brushing your teeth. As in sitting, your ears should fall over your shoulders and your shoulders should fall over your hips. You should keep a slight tension in your abdominal muscles to brace your spine. Your tailbone should point down to the ground, not behind your body, resulting in an over-extended swayback posture.   INCORRECT STANDING POSTURES  Common incorrect standing postures include a forward head, locked knees and/or an excessive swayback. WALKING Walk with an upright posture. Your ears, shoulders and hips should all line-up.  PROLONGED ACTIVITY IN A FLEXED POSITION When completing a task that requires you to bend forward at your waist or lean over a low surface, try to find a way to stabilize 3 out of 4 of your limbs. You can place a hand or elbow on your thigh or rest a knee on the surface you are reaching across. This will provide you more stability, so that your muscles do not tire as quickly. By keeping your knees relaxed, or slightly bent, you will also reduce stress across your lower back. CORRECT LIFTING TECHNIQUES  DO : Assume a wide stance. This will provide you more stability and the opportunity to get as close as possible to the object which you are lifting. Tense your abdominals to brace your spine. Bend at the knees and hips. Keeping your back locked in a neutral-spine position,  lift using your leg muscles. Lift with your legs, keeping your back straight. Test the weight of unknown objects before attempting to lift them. Try to keep your elbows locked down at your sides in order get the best strength from your shoulders when carrying an object.   Always ask for help when lifting heavy or awkward objects. INCORRECT LIFTING TECHNIQUES DO NOT:  Lock your knees when lifting, even if it is a small object. Bend and twist. Pivot at your feet or move your feet when needing to change directions. Assume that you can safely pick up even a paperclip without proper posture.

## 2023-11-01 ENCOUNTER — Encounter: Payer: Self-pay | Admitting: Family Medicine

## 2023-11-01 LAB — LIPID PANEL
Cholesterol: 170 mg/dL (ref <200)
HDL: 43 mg/dL — ABNORMAL LOW (ref >=50)
LDL Cholesterol (Calc): 106 mg/dL — ABNORMAL HIGH
Non-HDL Cholesterol (Calc): 127 mg/dL (ref <130)
Total CHOL/HDL Ratio: 4 (calc) (ref <5.0)
Triglycerides: 113 mg/dL (ref <150)

## 2023-11-01 LAB — CBC
HCT: 41.3 % (ref 35.0–45.0)
Hemoglobin: 14.6 g/dL (ref 11.7–15.5)
MCH: 31.5 pg (ref 27.0–33.0)
MCHC: 35.4 g/dL (ref 32.0–36.0)
MCV: 89.2 fL (ref 80.0–100.0)
MPV: 10 fL (ref 7.5–12.5)
Platelets: 338 10*3/uL (ref 140–400)
RBC: 4.63 10*6/uL (ref 3.80–5.10)
RDW: 12.2 % (ref 11.0–15.0)
WBC: 13.4 10*3/uL — ABNORMAL HIGH (ref 3.8–10.8)

## 2023-11-01 LAB — COMPREHENSIVE METABOLIC PANEL
AG Ratio: 1.4 (calc) (ref 1.0–2.5)
ALT: 27 U/L (ref 6–29)
AST: 15 U/L (ref 10–30)
Albumin: 4.6 g/dL (ref 3.6–5.1)
Alkaline phosphatase (APISO): 93 U/L (ref 31–125)
BUN: 20 mg/dL (ref 7–25)
CO2: 25 mmol/L (ref 20–32)
Calcium: 9.4 mg/dL (ref 8.6–10.2)
Chloride: 98 mmol/L (ref 98–110)
Creat: 0.86 mg/dL (ref 0.50–0.96)
Globulin: 3.4 g/dL (ref 1.9–3.7)
Glucose, Bld: 84 mg/dL (ref 65–99)
Potassium: 3.7 mmol/L (ref 3.5–5.3)
Sodium: 134 mmol/L — ABNORMAL LOW (ref 135–146)
Total Bilirubin: 0.8 mg/dL (ref 0.2–1.2)
Total Protein: 8 g/dL (ref 6.1–8.1)

## 2023-11-04 ENCOUNTER — Encounter: Payer: Commercial Managed Care - PPO | Admitting: Family Medicine

## 2023-12-01 ENCOUNTER — Encounter: Payer: Self-pay | Admitting: Family Medicine

## 2023-12-01 DIAGNOSIS — M549 Dorsalgia, unspecified: Secondary | ICD-10-CM

## 2023-12-04 NOTE — Addendum Note (Signed)
Addended byConrad Chisholm D on: 12/04/2023 09:44 AM   Modules accepted: Orders

## 2024-07-07 ENCOUNTER — Ambulatory Visit (INDEPENDENT_AMBULATORY_CARE_PROVIDER_SITE_OTHER): Admitting: Family Medicine

## 2024-07-07 ENCOUNTER — Encounter: Payer: Self-pay | Admitting: Family Medicine

## 2024-07-07 VITALS — BP 122/60 | HR 100 | Temp 99.7°F | Resp 18 | Ht 66.0 in | Wt 173.8 lb

## 2024-07-07 DIAGNOSIS — R3589 Other polyuria: Secondary | ICD-10-CM

## 2024-07-07 DIAGNOSIS — R300 Dysuria: Secondary | ICD-10-CM

## 2024-07-07 LAB — POCT URINALYSIS DIPSTICK
Bilirubin, UA: NEGATIVE
Blood, UA: NEGATIVE
Glucose, UA: NEGATIVE
Ketones, UA: NEGATIVE
Leukocytes, UA: NEGATIVE
Nitrite, UA: NEGATIVE
Protein, UA: NEGATIVE
Spec Grav, UA: <=1.005 — AB (ref 1.010–1.025)
Urobilinogen, UA: 0.2 U/dL
pH, UA: 6 (ref 5.0–8.0)

## 2024-07-07 NOTE — Progress Notes (Signed)
 Chief Complaint  Patient presents with   Dysuria    2 weeks , urgency     Sara Odom is a 28 y.o. female here for possible UTI.  Duration: 2 weeks. Symptoms: urgency, urinary frequency, urinary hesitancy, and odor Denies: hematuria, urinary retention, fever, nausea, and vomiting, vaginal discharge Hx of recurrent UTI? No Denies new sexual partners. Tried hydration and cranberry juice.   Past Medical History:  Diagnosis Date   Acne    Depression      BP 122/60   Pulse 100   Temp 99.7 F (37.6 C)   Resp 18   Ht 5' 6 (1.676 m)   Wt 173 lb 12.8 oz (78.8 kg)   LMP 06/19/2024   SpO2 100%   BMI 28.05 kg/m  General: Awake, alert, appears stated age Heart: RRR Lungs: CTAB, normal respiratory effort, no accessory muscle usage Abd: BS+, soft, NT, ND, no masses or organomegaly MSK: No CVA tenderness, neg Lloyd's sign Psych: Age appropriate judgment and insight  Polyuria - Plan: POCT Urinalysis Dipstick, Hemoglobin A1c  UA unremarkable.  Will check an A1c for reassurance.  There is no glucose noted on the dip.  Consider a stool softener to ensure constipation is not contributing.  Await culture results. Seek immediate care if pt starts to develop fevers, new/worsening symptoms, uncontrollable N/V. F/u prn. The patient voiced understanding and agreement to the plan.  Mabel [place], DO 07/07/24 4:43 PM

## 2024-07-07 NOTE — Patient Instructions (Signed)
 Stay hydrated.   Warning signs/symptoms: Uncontrollable nausea/vomiting, fevers, worsening symptoms despite treatment, confusion.  Give [place] around 2 business days to get culture back to you.  Let us know if you need anything.

## 2024-07-08 ENCOUNTER — Ambulatory Visit: Payer: Self-pay | Admitting: Family Medicine

## 2024-07-08 LAB — HEMOGLOBIN A1C: Hgb A1c MFr Bld: 5.6 % (ref 4.6–6.5)
